# Patient Record
Sex: Male | Born: 1945
Health system: Southern US, Community
[De-identification: ages and names within clinical notes are randomized; demographics above are authoritative.]

## PROBLEM LIST (undated history)

## (undated) DIAGNOSIS — N529 Male erectile dysfunction, unspecified: Secondary | ICD-10-CM

## (undated) DIAGNOSIS — E782 Mixed hyperlipidemia: Secondary | ICD-10-CM

## (undated) DIAGNOSIS — I10 Essential (primary) hypertension: Secondary | ICD-10-CM

## (undated) DIAGNOSIS — N400 Enlarged prostate without lower urinary tract symptoms: Secondary | ICD-10-CM

## (undated) DIAGNOSIS — K8020 Calculus of gallbladder without cholecystitis without obstruction: Secondary | ICD-10-CM

## (undated) DIAGNOSIS — K219 Gastro-esophageal reflux disease without esophagitis: Secondary | ICD-10-CM

## (undated) HISTORY — DX: Calculus of gallbladder without cholecystitis without obstruction: K80.20

## (undated) HISTORY — DX: Benign prostatic hyperplasia without lower urinary tract symptoms: N40.0

## (undated) HISTORY — DX: Mixed hyperlipidemia: E78.2

## (undated) HISTORY — DX: Gastro-esophageal reflux disease without esophagitis: K21.9

## (undated) HISTORY — DX: Male erectile dysfunction, unspecified: N52.9

## (undated) HISTORY — DX: Essential (primary) hypertension: I10

---

## 2011-05-08 ENCOUNTER — Ambulatory Visit (INDEPENDENT_AMBULATORY_CARE_PROVIDER_SITE_OTHER): Payer: No Typology Code available for payment source | Admitting: General Surgery

## 2011-05-08 ENCOUNTER — Encounter (INDEPENDENT_AMBULATORY_CARE_PROVIDER_SITE_OTHER): Payer: Self-pay | Admitting: General Surgery

## 2011-05-08 VITALS — BP 136/88 | HR 84 | Temp 98.4°F | Resp 12 | Ht 71.0 in | Wt 205.0 lb

## 2011-05-08 DIAGNOSIS — K8010 Calculus of gallbladder with chronic cholecystitis without obstruction: Secondary | ICD-10-CM

## 2011-05-08 NOTE — Progress Notes (Signed)
Subjective:     Patient ID: Joshua Ryan., male   DOB: Jan 02, 1946, 65 y.o.   MRN: [id number] The patient is a 65 year old Caucasian male who is referred to [place] by Dr. Duane Lope for minimally symptomatic gallstone the patient says he was seen in Dr. Tenny Craw who was annual physical examination and he asked if there was anything else and he described intermitted pain in the right upper abdomen and he was referred for an ultrasound that the needle ultrasound lab that showed a 1.4 cm stone in the neck of the gallbladder but no abnormalities of inflammation or thickening of the gallbladder wall or dilated common bile to. The patient states that he has had pain in the right shoulder but at that time he had definite limitation of motion of his shoulder and this pain does not seem to be the pain that was prominent referred gallbladder. It is not described his pain has awakened him in a night all of the very severe nature but more of a fullness but not necessarily associated with postprandial pain the patient has been followed for slightly elevated PSA and a mildly elevated glucose and possibly GERD HPI   Review of Systems History reviewed. No pertinent past surgical history. No Known Allergies Current Outpatient Prescriptions  Medication Sig Dispense Refill  . aspirin 81 MG tablet Take 81 mg by mouth daily.        . cholecalciferol (VITAMIN D) 1000 UNITS tablet Take 1,000 Units by mouth daily.        Marland Kitchen KRILL OIL PO Take by mouth.        Marland Kitchen omeprazole (PRILOSEC) 20 MG capsule Take 20 mg by mouth daily.        . Saw Palmetto, Serenoa repens, (SAW PALMETTO PO) Take by mouth daily.        Marland Kitchen telmisartan (MICARDIS) 80 MG tablet Take 80 mg by mouth daily.        . vitamin E (VITAMIN E) 400 UNIT capsule Take 400 Units by mouth daily.             Objective:   Physical ExamBP 136/88  Pulse 84  Temp(Src) 98.4 F (36.9 C) (Temporal)  Resp 12  Ht 5\' 11"  (1.803 m)  Wt 205 lb (92.987 kg)  BMI 28.59 kg/m2 On  physical examination the patient is in no pain at examination and palpation of the right upper quadrant he's got good breath sounds bilaterally and I was unable to review his ultrasound with him since it was performed at the [facility] ultrasound lab which is not in at the epic. I reviewed the low booklet with him the gallbladder causes pain I could not find any evidence of any recent liver tests performed even though Dr. Tenny Craw in his history stated the liver was normal I took that that he does not have an enlarged liver even that there was some question of fatty infiltration of liver on ultrasound.   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 I have reviewed the low booklet with him about small portions, low fat ,no bulky food and not to eat any head of bed shortly afterwards. He'll discuss this with his wife  and he elects not to proceed with cholecystectomy in the near future understands that I will be retiring at the and of December and that he would need to be reexamined briefly by one of the partners therefore proceeded with the cholecystectomy next year or at a later time    Plan:     Possible schedule of cholecystectomy this year patient probably will elect to wait and have his surgery performed early next year. If he is having more intense or more frequent attacks he understands that he should proceed with a cholecystectomy promptly and it could most likely be done as an outpatient at [facility].

## 2011-05-08 NOTE — Patient Instructions (Signed)
If you have a gallbladder attack he need to consider having a cholecystectomy and cholangiogram. Next recurrent and prevent a gallbladder attack its important to stay on, low-fa,t diet not eating and when a bed for 2-3 hour and avoid eating a large bulky meal salad etc. if you are having more frequent attacks to recommend proceed on with a laparoscopic cholecystectomy and cholangiogram. I am retiring at the end of the year have poor does be happy to manage her problem is elected this up and after 1st of January

## 2011-07-31 ENCOUNTER — Encounter (INDEPENDENT_AMBULATORY_CARE_PROVIDER_SITE_OTHER): Payer: Self-pay | Admitting: Obstetrics and Gynecology

## 2015-01-26 ENCOUNTER — Encounter: Payer: Self-pay | Admitting: Vascular Surgery

## 2015-01-26 ENCOUNTER — Other Ambulatory Visit: Payer: Self-pay | Admitting: *Deleted

## 2015-01-26 DIAGNOSIS — L97222 Non-pressure chronic ulcer of left calf with fat layer exposed: Secondary | ICD-10-CM

## 2015-01-26 DIAGNOSIS — I83893 Varicose veins of bilateral lower extremities with other complications: Secondary | ICD-10-CM

## 2015-04-13 ENCOUNTER — Encounter: Payer: Self-pay | Admitting: Vascular Surgery

## 2015-04-13 ENCOUNTER — Encounter (HOSPITAL_COMMUNITY): Payer: Self-pay

## 2015-05-19 ENCOUNTER — Encounter: Payer: Self-pay | Admitting: Surgery

## 2015-05-23 ENCOUNTER — Encounter: Payer: Self-pay | Admitting: Surgery

## 2015-05-23 ENCOUNTER — Ambulatory Visit (HOSPITAL_COMMUNITY)
Admission: RE | Admit: 2015-05-23 | Discharge: 2015-05-23 | Disposition: A | Discharge status: Home / Self Care | Payer: BLUE CROSS/BLUE SHIELD | Source: Ambulatory Visit | Attending: Vascular Surgery | Admitting: Vascular Surgery

## 2015-05-23 ENCOUNTER — Ambulatory Visit (INDEPENDENT_AMBULATORY_CARE_PROVIDER_SITE_OTHER): Payer: BLUE CROSS/BLUE SHIELD | Admitting: Surgery

## 2015-05-23 VITALS — BP 153/93 | HR 88 | Temp 98.5°F | Resp 16 | Ht 70.0 in | Wt 194.0 lb

## 2015-05-23 DIAGNOSIS — I83893 Varicose veins of bilateral lower extremities with other complications: Secondary | ICD-10-CM | POA: Diagnosis not present

## 2015-05-23 DIAGNOSIS — I872 Venous insufficiency (chronic) (peripheral): Secondary | ICD-10-CM | POA: Diagnosis not present

## 2015-05-23 NOTE — Progress Notes (Signed)
Patient name: Joshua Ryan. MRN: [id number] DOB: September 10, 1945 Sex: male   Referred by: Dr. Harrington Challenger  Reason for referral:  Chief Complaint  Patient presents with  . New Evaluation    bilateral varicose veins    HISTORY OF PRESENT ILLNESS: This is a very pleasant 69 year old gentleman who comes in today for evaluation of varicose veins.  Patient has had varicosities for many years.  He complains that they itch and he is constantly scratching them.  After standing on his legs for long periods of time he feels that his legs get to be very tight.  He denies having a DVT.  He denies having any bleeding episodes.he has never worn compression stockings  Patient's medically managed for hypertension.  He also suffers from reflux disease.  Past Medical History  Diagnosis Date  . HTN (hypertension)   . Hematuria   . GERD (gastroesophageal reflux disease)   . Mixed hyperlipidemia   . ED (erectile dysfunction)   . BPH (benign prostatic hyperplasia)   . Gallstones     No past surgical history on file.  Social History   Social History  . Marital Status: Married    Spouse Name: N/A  . Number of Children: N/A  . Years of Education: N/A   Occupational History  . Not on file.   Social History Main Topics  . Smoking status: Never Smoker   . Smokeless tobacco: Never Used  . Alcohol Use: 0.0 oz/week    0 Standard drinks or equivalent per week  . Drug Use: No  . Sexual Activity: Not on file   Other Topics Concern  . Not on file   Social History Narrative    Family History  Problem Relation Age of Onset  . Lung disease Father     chronic obx lung disease  . Breast cancer Sister   . Cancer Maternal Grandmother     lung?    Allergies as of 05/23/2015  . (No Known Allergies)    Current Outpatient Prescriptions on File Prior to Visit  Medication Sig Dispense Refill  . aspirin 81 MG tablet Take 81 mg by mouth daily.      . cholecalciferol (VITAMIN D) 1000 UNITS  tablet Take 1,000 Units by mouth daily.      Marland Kitchen KRILL OIL PO Take by mouth.      Marland Kitchen omeprazole (PRILOSEC) 20 MG capsule Take 20 mg by mouth daily.      . Saw Palmetto, Serenoa repens, (SAW PALMETTO PO) Take by mouth daily.      Marland Kitchen telmisartan (MICARDIS) 80 MG tablet Take 80 mg by mouth daily.      . vitamin E (VITAMIN E) 400 UNIT capsule Take 400 Units by mouth daily.       No current facility-administered medications on file prior to visit.     REVIEW OF SYSTEMS: Cardiovascular: No chest pain, chest pressure, palpitations, orthopnea, or dyspnea on exertion. No claudication or rest pain,  No history of DVT or phlebitis. Pulmonary: No productive cough, asthma or wheezing. Neurologic: No weakness, paresthesias, aphasia, or amaurosis. No dizziness. Hematologic: No bleeding problems or clotting disorders. Musculoskeletal: No joint pain or joint swelling. Gastrointestinal: No blood in stool or hematemesis Genitourinary: No dysuria or hematuria. Psychiatric:: No history of major depression. Integumentary: No rashes or ulcers. Constitutional: No fever or chills.  PHYSICAL EXAMINATION:  Filed Vitals:   05/23/15 1442 05/23/15 1445  BP: 152/95 153/93  Pulse: 88 88  Temp: 98.5 F (  36.9 C)   Resp: 16   Height: 5\' 10"  (1.778 m)   Weight: 194 lb (87.998 kg)   SpO2: 98%    Body mass index is 27.84 kg/(m^2). General: The patient appears their stated age.   HEENT:  No gross abnormalities Pulmonary: Respirations are non-labored Musculoskeletal: There are no major deformities.   Neurologic: No focal weakness or paresthesias are detected, Skin: There are no ulcer or rashes noted. Psychiatric: The patient has normal affect. Cardiovascular: palpable pedal pulses bilaterally.  Trace edema bilaterally.  Prominent varicosity going anteriorly on the left  Diagnostic Studies: I have ordered and reviewed his venous reflux evaluation.  On the right he has reflux in the common femoral femoral popliteal  and saphenofemoral vein.  He also has reflux and a vein of Giacomini.  Maximum vein diameter on the right is 0.60 saphenofemoral junction.  On the left there is reflux in the common femoral, femoral, popliteal and saphenofemoral junction.  Maximum diameter 0.56 cm.   Assessment:  Chronic venous insufficiency, bilateral lower extremity, left greater than right Plan: The patient was placed into 20-30 thigh-high compression stockings.  He is also encouraged to keep his legs elevated.  He will return in 3 months for repeat evaluation and consideration of laser ablation and stab phlebectomy of the prominent varicosities on the left and possibly on the right     V. Leia Alf, M.D. [facility] Office: [phone] Pager:  [phone]

## 2015-08-17 ENCOUNTER — Encounter: Payer: Self-pay | Admitting: Vascular Surgery

## 2015-08-23 ENCOUNTER — Encounter: Payer: Self-pay | Admitting: Vascular Surgery

## 2015-08-23 ENCOUNTER — Ambulatory Visit (INDEPENDENT_AMBULATORY_CARE_PROVIDER_SITE_OTHER): Payer: BLUE CROSS/BLUE SHIELD | Admitting: Vascular Surgery

## 2015-08-23 VITALS — BP 143/96 | HR 80 | Temp 98.0°F | Resp 16 | Ht 71.0 in | Wt 195.0 lb

## 2015-08-23 DIAGNOSIS — I83893 Varicose veins of bilateral lower extremities with other complications: Secondary | ICD-10-CM | POA: Diagnosis not present

## 2015-08-23 NOTE — Progress Notes (Signed)
Subjective:     Patient ID: Joshua Shorten., male   DOB: 12/10/1945, 70 y.o.   MRN: [id number]  HPI This 70 year old male returns for continued follow-up regarding his painful varicosities in both legs left worse than right. He has large ropey varicosities in the left anterior thigh lateral thigh and lateral calf and also bulging varicosities in the right posterior calf extending medially. He has a history of DVT, from a phlebitis, stasis ulcers, or bleeding. He has developed swelling is a day progresses. He has tried long-leg elastic compression stockings 20-30 mm gradient as well as elevation and ibuprofen without success. This is affecting his daily living because of the pain and swelling.   Past Medical History  Diagnosis Date  . HTN (hypertension)   . Hematuria   . GERD (gastroesophageal reflux disease)   . Mixed hyperlipidemia   . ED (erectile dysfunction)   . BPH (benign prostatic hyperplasia)   . Gallstones     Social History  Substance Use Topics  . Smoking status: Never Smoker   . Smokeless tobacco: Never Used  . Alcohol Use: 0.0 oz/week    0 Standard drinks or equivalent per week    Family History  Problem Relation Age of Onset  . Lung disease Father     chronic obx lung disease  . Breast cancer Sister   . Cancer Maternal Grandmother     lung?    No Known Allergies   Current outpatient prescriptions:  .  aspirin 81 MG tablet, Take 81 mg by mouth daily.  , Disp: , Rfl:  .  cholecalciferol (VITAMIN D) 1000 UNITS tablet, Take 1,000 Units by mouth daily.  , Disp: , Rfl:  .  KRILL OIL PO, Take by mouth.  , Disp: , Rfl:  .  omeprazole (PRILOSEC) 20 MG capsule, Take 20 mg by mouth daily.  , Disp: , Rfl:  .  Saw Palmetto, Serenoa repens, (SAW PALMETTO PO), Take by mouth daily.  , Disp: , Rfl:  .  telmisartan (MICARDIS) 80 MG tablet, Take 80 mg by mouth daily.  , Disp: , Rfl:  .  vitamin E (VITAMIN E) 400 UNIT capsule, Take 400 Units by mouth daily.  , Disp: , Rfl:    Filed Vitals:   08/23/15 1337 08/23/15 1338  BP: 150/98 143/96  Pulse: 81 80  Temp: 98 F (36.7 C)   Resp: 16   Height: 5\' 11"  (1.803 m)   Weight: 195 lb (88.451 kg)   SpO2: 99%     Body mass index is 27.21 kg/(m^2).           Review of Systems Denies chest pain, dyspnea on exertion, PND, orthopnea, hemoptysis, tobacco abuse.     Objective:   Physical Exam BP 143/96 mmHg  Pulse 80  Temp(Src) 98 F (36.7 C)  Resp 16  Ht 5\' 11"  (1.803 m)  Wt 195 lb (88.451 kg)  BMI 27.21 kg/m2  SpO2 99%   Gen. Well-developed well-nourished male in no apparent distress alert and oriented 3  left leg with extensive large ropey varicosities beginning in the mid thigh anteriorly extending down to lateral to the knee and the lateral calf. 1+ edema distally. No active ulcer noted. 3+ dorsalis pedis pulse palpable. Right leg with bulging varicosities in the posterior calf extending medially with no hyperpigmentation or ulceration noted. 3+ dorsalis pedis pulse palpable.  I reviewed the formal duplex exam from last visit and also performed an independent SonoSite bedside exam today. Left  leg has gross reflux in the great saphenous vein proximally which extends laterally to supply these large tortuous bulging varicosities. There is no DVT on the left. The right leg has a large right small saphenous vein supplying the painful varicosities in the calf which extends up into the mid thigh of large caliber. There is gross reflux in both of these large veins.     Assessment:      bilateral painful varicosities due to gross reflux left great saphenous system in right small saphenous system. These are causing symptoms which are resistant to conservative measures including long-leg elastic compression stockings 20-30 mm gradient, elevation, and ibuprofen. This is affecting patient's daily living.     Plan:      patient needs number-one laser ablation left great saphenous vein plus greater than 20  stabs back with painful varicosities followed by #2 laser ablation right small saphenous vein plus 10-20 stab phlebectomy of painful varicosities. We will proceed with precertification for medicine the near future and relieve his symptoms.

## 2015-09-13 ENCOUNTER — Other Ambulatory Visit: Payer: Self-pay | Admitting: *Deleted

## 2015-09-13 DIAGNOSIS — I83893 Varicose veins of bilateral lower extremities with other complications: Secondary | ICD-10-CM

## 2015-10-07 ENCOUNTER — Encounter: Payer: Self-pay | Admitting: Vascular Surgery

## 2015-10-14 ENCOUNTER — Encounter: Payer: Self-pay | Admitting: Vascular Surgery

## 2015-10-17 ENCOUNTER — Encounter: Payer: Self-pay | Admitting: Vascular Surgery

## 2015-10-17 ENCOUNTER — Ambulatory Visit (INDEPENDENT_AMBULATORY_CARE_PROVIDER_SITE_OTHER): Payer: BLUE CROSS/BLUE SHIELD | Admitting: Vascular Surgery

## 2015-10-17 VITALS — BP 159/97 | HR 77 | Temp 98.0°F | Resp 16 | Ht 69.5 in | Wt 186.6 lb

## 2015-10-17 DIAGNOSIS — I83892 Varicose veins of left lower extremities with other complications: Secondary | ICD-10-CM | POA: Insufficient documentation

## 2015-10-17 NOTE — Progress Notes (Signed)
Laser Ablation Procedure    Date: 10/17/2015   Joshua Ryan. DOB:19-Mar-1946  Consent signed: Yes    Surgeon:  Dr. Nelda Severe. Kellie Simmering  Procedure: Laser Ablation: left Greater Saphenous Vein  BP 159/97 mmHg  Pulse 77  Temp(Src) 98 F (36.7 C)  Resp 16  Ht 5' 9.5" (1.765 m)  Wt 186 lb 9.6 oz (84.641 kg)  BMI 27.17 kg/m2  SpO2 98%  Tumescent Anesthesia: 300 cc 0.9% NaCl with 50 cc Lidocaine HCL with 1% Epi and 15 cc 8.4% NaHCO3  Local Anesthesia: 7 cc Lidocaine HCL and NaHCO3 (ratio 2:1)  Pulsed Mode: 15 watts, 562ms delay, 1.0 duration  Total Energy:    928          Total Pulses: 62               Total Time: 1:02    Stab Phlebectomy: >20 Sites: Thigh and Calf  Patient tolerated procedure well  Notes:   Description of Procedure:  After marking the course of the secondary varicosities, the patient was placed on the operating table in the supine position, and the left leg was prepped and draped in sterile fashion.   Local anesthetic was administered and under ultrasound guidance the saphenous vein was accessed with a micro needle and guide wire; then the mirco puncture sheath was placed.  A guide wire was inserted saphenofemoral junction , followed by a 5 french sheath.  The position of the sheath and then the laser fiber below the junction was confirmed using the ultrasound.  Tumescent anesthesia was administered along the course of the saphenous vein using ultrasound guidance. The patient was placed in Trendelenburg position and protective laser glasses were placed on patient and staff, and the laser was fired at 15 watts continuous mode advancing 1-16mm/second for a total of 928 joules.   For stab phlebectomies, local anesthetic was administered at the previously marked varicosities, and tumescent anesthesia was administered around the vessels.  Greater than 20 stab wounds were made using the tip of an 11 blade. And using the vein hook, the phlebectomies were performed using a  hemostat to avulse the varicosities.  Adequate hemostasis was achieved.    Steri strips were applied to the stab wounds and ABD pads and thigh high compression stockings were applied.  Ace wrap bandages were applied over the phlebectomy sites and at the top of the saphenofemoral junction. Blood loss was less than 15 cc.  The patient ambulated out of the operating room having tolerated the procedure well.2

## 2015-10-17 NOTE — Progress Notes (Signed)
Subjective:     Patient ID: Joshua Ryan., male   DOB: 09-01-1945, 70 y.o.   MRN: [id number]  HPI  this 70 year old male had laser ablation left great saphenous vein plus greater than 20 stab phlebectomy of painful varicosities performed under local tumescent anesthesia. A total of 900 J of energy was utilized. He tolerated the procedure well.   Review of Systems     Objective:   Physical Exam BP 159/97 mmHg  Pulse 77  Temp(Src) 98 F (36.7 C)  Resp 16  Ht 5' 9.5" (1.765 m)  Wt 186 lb 9.6 oz (84.641 kg)  BMI 27.17 kg/m2  SpO2 98%       Assessment:     Well-tolerated laser ablation left great saphenous vein-anterior accessory branch plus greater than 20 stab phlebectomy of painful varicosities performed under local tumescent anesthesia    Plan:     Return in 1 week for venous duplex exam to confirm closure left great saphenous vein

## 2015-10-18 ENCOUNTER — Telehealth: Payer: Self-pay | Admitting: *Deleted

## 2015-10-18 NOTE — Telephone Encounter (Signed)
Pt was sleeping so talked to the wife who reports he had a good night and had no bleeding from the stab phleb sites. Discussed ace wraps. Follow prn.

## 2015-10-21 ENCOUNTER — Encounter: Payer: Self-pay | Admitting: Vascular Surgery

## 2015-10-24 ENCOUNTER — Encounter: Payer: Self-pay | Admitting: Vascular Surgery

## 2015-10-24 ENCOUNTER — Ambulatory Visit (INDEPENDENT_AMBULATORY_CARE_PROVIDER_SITE_OTHER): Payer: Self-pay | Admitting: Vascular Surgery

## 2015-10-24 ENCOUNTER — Ambulatory Visit (HOSPITAL_COMMUNITY)
Admission: RE | Admit: 2015-10-24 | Discharge: 2015-10-24 | Disposition: A | Discharge status: Home / Self Care | Payer: BLUE CROSS/BLUE SHIELD | Source: Ambulatory Visit | Attending: Vascular Surgery | Admitting: Vascular Surgery

## 2015-10-24 VITALS — BP 148/97 | HR 64 | Temp 99.5°F | Resp 18 | Ht 69.5 in | Wt 188.5 lb

## 2015-10-24 DIAGNOSIS — Z9889 Other specified postprocedural states: Secondary | ICD-10-CM | POA: Diagnosis not present

## 2015-10-24 DIAGNOSIS — I10 Essential (primary) hypertension: Secondary | ICD-10-CM | POA: Insufficient documentation

## 2015-10-24 DIAGNOSIS — K219 Gastro-esophageal reflux disease without esophagitis: Secondary | ICD-10-CM | POA: Insufficient documentation

## 2015-10-24 DIAGNOSIS — I83893 Varicose veins of bilateral lower extremities with other complications: Secondary | ICD-10-CM

## 2015-10-24 DIAGNOSIS — E782 Mixed hyperlipidemia: Secondary | ICD-10-CM | POA: Insufficient documentation

## 2015-10-24 NOTE — Progress Notes (Signed)
Subjective:     Patient ID: Joshua Shorten., male   DOB: [birth year], 70 y.o.   MRN: [id number]  HPI this 70 year old male returns 1 week post-laser ablation left great saphenous vein with multiple stab phlebectomy of painful varicosities for gross reflux. Patient has had some mild discomfort along the course of the anterior accessory branch of the left great saphenous vein. He's had no pain in the stab phlebectomy sites. He has worn his elastic compression stockings and take an ibuprofen as instructed. He has had no chest pain, dyspnea on exertion, PND, orthopnea, or hemoptysis.     Review of Systems     Objective:   Physical Exam BP 148/97 mmHg  Pulse 64  Temp(Src) 99.5 F (37.5 C) (Oral)  Resp 18  Ht 5' 9.5" (1.765 m)  Wt 188 lb 8 oz (85.503 kg)  BMI 27.45 kg/m2  Gen. well-developed well-nourished male no apparent distress alert and oriented  3 Left leg with mild discomfort to deep palpation and anterior thigh extending up to the inguinal crease. Stab phlebectomy sites all healing nicely with Steri-Strips in place. No distal edema noted. 3+ dorsalis pedis pulse palpable.  Today I ordered a venous duplex exam the left leg which I reviewed and interpreted. There is no DVT. There is total closure of the anterior accessory branch of the great saphenous vein from the distal thigh up to near the saphenofemoral junction     Assessment:     Successful laser ablation left anterior accessory branch great saphenous vein plus multiple stab phlebectomy of painful varicosities-patient doing very well    Plan:     Plan return next week for laser ablation right small saphenous vein with multiple stab phlebectomy

## 2015-10-26 ENCOUNTER — Encounter: Payer: Self-pay | Admitting: Vascular Surgery

## 2015-10-31 ENCOUNTER — Ambulatory Visit (INDEPENDENT_AMBULATORY_CARE_PROVIDER_SITE_OTHER): Payer: BLUE CROSS/BLUE SHIELD | Admitting: Vascular Surgery

## 2015-10-31 ENCOUNTER — Encounter: Payer: Self-pay | Admitting: Vascular Surgery

## 2015-10-31 VITALS — BP 145/97 | HR 76 | Temp 97.8°F | Resp 16 | Ht 70.5 in | Wt 188.0 lb

## 2015-10-31 DIAGNOSIS — I83891 Varicose veins of right lower extremities with other complications: Secondary | ICD-10-CM | POA: Insufficient documentation

## 2015-10-31 NOTE — Progress Notes (Signed)
Laser Ablation Procedure    Date: 10/31/2015   Joshua Ryan. DOB:May 21, 1946  Consent signed: Yes    Surgeon:  Dr. Nelda Severe. Kellie Simmering  Procedure: Laser Ablation: right Small Saphenous Vein  BP 145/97 mmHg  Pulse 76  Temp(Src) 97.8 F (36.6 C)  Resp 16  Ht 5' 10.5" (1.791 m)  Wt 188 lb (85.276 kg)  BMI 26.58 kg/m2  SpO2 98%  Tumescent Anesthesia: 180 cc 0.9% NaCl with 50 cc Lidocaine HCL with 1% Epi and 15 cc 8.4% NaHCO3  Local Anesthesia: 5 cc Lidocaine HCL and NaHCO3 (ratio 2:1)  Pulsed Mode: 15 watts, 558ms delay, 1.0 duration  Total Energy: 569             Total Pulses:  39              Total Time: :38    Stab Phlebectomy: 10-20 Sites: Thigh and Calf  Patient tolerated procedure well  Notes:   Description of Procedure:  After marking the course of the secondary varicosities, the patient was placed on the operating table in the prone position, and the right leg was prepped and draped in sterile fashion.   Local anesthetic was administered and under ultrasound guidance the saphenous vein was accessed with a micro needle and guide wire; then the mirco puncture sheath was placed.  A guide wire was inserted saphenopopliteal junction , followed by a 5 french sheath.  The position of the sheath and then the laser fiber below the junction was confirmed using the ultrasound.  Tumescent anesthesia was administered along the course of the saphenous vein using ultrasound guidance. The patient was placed in Trendelenburg position and protective laser glasses were placed on patient and staff, and the laser was fired at 15 watts continuous mode advancing 1-29mm/second for a total of 569 joules.   For stab phlebectomies, local anesthetic was administered at the previously marked varicosities, and tumescent anesthesia was administered around the vessels.  Ten to 20 stab wounds were made using the tip of an 11 blade. And using the vein hook, the phlebectomies were performed using a hemostat  to avulse the varicosities.  Adequate hemostasis was achieved.     Steri strips were applied to the stab wounds and ABD pads and thigh high compression stockings were applied.  Ace wrap bandages were applied over the phlebectomy sites and at the top of the saphenopopliteal junction. Blood loss was less than 15 cc.  The patient ambulated out of the operating room having tolerated the procedure well.

## 2015-10-31 NOTE — Progress Notes (Signed)
Filed Vitals:   10/31/15 1237 10/31/15 1238  BP: 146/97 145/97  Pulse: 76   Temp: 97.8 F (36.6 C)   Resp: 16   Height: 5' 10.5" (1.791 m)   Weight: 188 lb (85.276 kg)   SpO2: 98%

## 2015-10-31 NOTE — Progress Notes (Signed)
Subjective:     Patient ID: Joshua Shorten., male   DOB: 17-Oct-1945, 70 y.o.   MRN: [id number]  HPI this 70 year old male had laser ablation of the right small saphenous vein plus multiple stab phlebectomy of painful varicosities performed under local tumescent anesthesia. A total of 689 J of energy was utilized. He tolerated the procedure well.   Review of Systems     Objective:   Physical Exam BP 145/97 mmHg  Pulse 76  Temp(Src) 97.8 F (36.6 C)  Resp 16  Ht 5' 10.5" (1.791 m)  Wt 188 lb (85.276 kg)  BMI 26.58 kg/m2  SpO2 98%       Assessment:     Well-tolerated laser ablation right small saphenous vein plus multiple stab phlebectomy (10-20) of painful varicosities performed under local tumescent anesthesia    Plan:     Return in 1 week for venous duplex exam to confirm closure right small saphenous vein

## 2015-11-01 ENCOUNTER — Telehealth: Payer: Self-pay | Admitting: *Deleted

## 2015-11-01 NOTE — Telephone Encounter (Signed)
Pt was still asleep. Wife said he had a good night. No bleeding, pain, concerns. Following all instructions.

## 2015-11-07 ENCOUNTER — Encounter: Payer: Self-pay | Admitting: Vascular Surgery

## 2015-11-07 ENCOUNTER — Ambulatory Visit (HOSPITAL_COMMUNITY)
Admission: RE | Admit: 2015-11-07 | Discharge: 2015-11-07 | Disposition: A | Discharge status: Home / Self Care | Payer: BLUE CROSS/BLUE SHIELD | Source: Ambulatory Visit | Attending: Vascular Surgery | Admitting: Vascular Surgery

## 2015-11-07 ENCOUNTER — Ambulatory Visit (INDEPENDENT_AMBULATORY_CARE_PROVIDER_SITE_OTHER): Payer: Self-pay | Admitting: Vascular Surgery

## 2015-11-07 VITALS — BP 135/93 | HR 70 | Temp 98.0°F | Resp 16 | Ht 70.5 in | Wt 188.0 lb

## 2015-11-07 DIAGNOSIS — I83891 Varicose veins of right lower extremities with other complications: Secondary | ICD-10-CM

## 2015-11-07 DIAGNOSIS — I83893 Varicose veins of bilateral lower extremities with other complications: Secondary | ICD-10-CM | POA: Diagnosis present

## 2015-11-07 DIAGNOSIS — E782 Mixed hyperlipidemia: Secondary | ICD-10-CM | POA: Insufficient documentation

## 2015-11-07 DIAGNOSIS — K219 Gastro-esophageal reflux disease without esophagitis: Secondary | ICD-10-CM | POA: Diagnosis not present

## 2015-11-07 DIAGNOSIS — I10 Essential (primary) hypertension: Secondary | ICD-10-CM | POA: Insufficient documentation

## 2015-11-07 NOTE — Progress Notes (Signed)
Subjective:     Patient ID: Joshua Ryan., male   DOB: 01-15-1946, 70 y.o.   MRN: [id number]  HPI this 70 year old male returns 1 week post-laser ablation right small saphenous vein with multiple stab phlebectomy of painful varicosities. He has had mild discomfort in the posterior calf which is now essentially resolved. He has worn his elastic compression stockings and take an ibuprofen as instructed. He has not appreciated any distal swelling. Stab phlebectomy sites have caused no pain.   Review of Systems     Objective:   Physical Exam BP 135/93 mmHg  Pulse 70  Temp(Src) 98 F (36.7 C)  Resp 16  Ht 5' 10.5" (1.791 m)  Wt 188 lb (85.276 kg)  BMI 26.58 kg/m2  SpO2 98%  Gen. well-developed well-nourished male no apparent distress alert and oriented 3 Right leg with nicely healing stab phlebectomy sites. No distal edema noted. 3+ dorsalis pedis pulse palpable. Mild discomfort to deep palpation over small saphenous vein.  Today I ordered a venous duplex exam the right leg which I reviewed and interpreted. There is no DVT. There is total closure of the right small saphenous vein.     Assessment:     Successful laser ablation right small saphenous vein with multiple stab phlebectomy and left great saphenous vein with multiple stab phlebectomy for painful varicosities    Plan:     Return to see me on when necessary basis

## 2016-11-06 ENCOUNTER — Encounter: Payer: Self-pay | Admitting: Oncology

## 2016-11-29 ENCOUNTER — Telehealth: Payer: Self-pay | Admitting: Oncology

## 2016-11-29 ENCOUNTER — Ambulatory Visit (HOSPITAL_BASED_OUTPATIENT_CLINIC_OR_DEPARTMENT_OTHER): Payer: BLUE CROSS/BLUE SHIELD

## 2016-11-29 ENCOUNTER — Ambulatory Visit (HOSPITAL_BASED_OUTPATIENT_CLINIC_OR_DEPARTMENT_OTHER): Payer: BLUE CROSS/BLUE SHIELD | Admitting: Oncology

## 2016-11-29 VITALS — BP 131/87 | HR 85 | Temp 98.6°F | Resp 18 | Ht 70.0 in | Wt 176.8 lb

## 2016-11-29 DIAGNOSIS — I10 Essential (primary) hypertension: Secondary | ICD-10-CM | POA: Diagnosis not present

## 2016-11-29 DIAGNOSIS — D751 Secondary polycythemia: Secondary | ICD-10-CM

## 2016-11-29 DIAGNOSIS — M353 Polymyalgia rheumatica: Secondary | ICD-10-CM | POA: Diagnosis not present

## 2016-11-29 DIAGNOSIS — D45 Polycythemia vera: Secondary | ICD-10-CM

## 2016-11-29 LAB — COMPREHENSIVE METABOLIC PANEL
ALT: 22 U/L (ref 0–55)
AST: 19 U/L (ref 5–34)
Albumin: 4.2 g/dL (ref 3.5–5.0)
Alkaline Phosphatase: 83 U/L (ref 40–150)
Anion Gap: 9 mEq/L (ref 3–11)
BUN: 20.3 mg/dL (ref 7.0–26.0)
CHLORIDE: 106 meq/L (ref 98–109)
CO2: 26 meq/L (ref 22–29)
Calcium: 9.9 mg/dL (ref 8.4–10.4)
Creatinine: 1.1 mg/dL (ref 0.7–1.3)
EGFR: 69 mL/min/{1.73_m2} — AB (ref >90)
GLUCOSE: 86 mg/dL (ref 70–140)
POTASSIUM: 4.5 meq/L (ref 3.5–5.1)
SODIUM: 141 meq/L (ref 136–145)
Total Bilirubin: 1.63 mg/dL — ABNORMAL HIGH (ref 0.20–1.20)
Total Protein: 6.8 g/dL (ref 6.4–8.3)

## 2016-11-29 LAB — CBC WITH DIFFERENTIAL/PLATELET
BASO%: 0.8 % (ref 0.0–2.0)
BASOS ABS: 0.2 10*3/uL — AB (ref 0.0–0.1)
EOS%: 2.3 % (ref 0.0–7.0)
Eosinophils Absolute: 0.5 10*3/uL (ref 0.0–0.5)
HCT: 61.5 % — ABNORMAL HIGH (ref 38.4–49.9)
HGB: 19.4 g/dL — ABNORMAL HIGH (ref 13.0–17.1)
LYMPH%: 11.3 % — AB (ref 14.0–49.0)
MCH: 23 pg — ABNORMAL LOW (ref 27.2–33.4)
MCHC: 31.5 g/dL — AB (ref 32.0–36.0)
MCV: 72.9 fL — AB (ref 79.3–98.0)
MONO#: 1.1 10*3/uL — ABNORMAL HIGH (ref 0.1–0.9)
MONO%: 5.6 % (ref 0.0–14.0)
NEUT#: 15.7 10*3/uL — ABNORMAL HIGH (ref 1.5–6.5)
NEUT%: 80 % — AB (ref 39.0–75.0)
NRBC: 0 % (ref 0–0)
Platelets: 604 10*3/uL — ABNORMAL HIGH (ref 140–400)
RBC: 8.44 10*6/uL — AB (ref 4.20–5.82)
RDW: 22.6 % — AB (ref 11.0–14.6)
WBC: 19.7 10*3/uL — ABNORMAL HIGH (ref 4.0–10.3)
lymph#: 2.2 10*3/uL (ref 0.9–3.3)

## 2016-11-29 NOTE — Telephone Encounter (Signed)
Gave patient AVS and calender per 5/17 los.  

## 2016-11-29 NOTE — Progress Notes (Signed)
Reason for Referral: Polycythemia.   HPI: 71 year old gentleman with history of hypertension and polymyalgia rheumatica referred to me for reevaluation of polycythemia. He is a gentleman in reasonably good health who have had recurrent PMR in the past and was treated with prednisone with excellent success. Most recently developed a relapse of this condition and was evaluated by Dr. Lenna Gilford and was started on prednisone at 20 mg daily. He is currently been tapered down to 12.5. He had a CBC done which showed a white cell count of 28.4, hemoglobin of 19.3 and a platelet count of 684. His MCV was 69. The differential showed normal lymphocytes, lymphs monocytes and eosinophils and basophils. Repeat CBC confirmed these findings and he was referred to me for evaluation regarding these issues. Clinically, he is asymptomatic at this time. He denied any headaches or neurological deficits. He denied any angina or dyspnea on exertion. He remains active and continues to work full time. He denied any smoking or toxic fume inhalation. He denied been on any hormone supplements.  He does not report any neurological deficits including alteration of mental status psychiatric issues or depression. He does not report any fevers, chills, sweats or weight loss. He denied any fatigue or excessive tiredness. He does not report any chest pain, palpitation, orthopnea or leg edema. He does not report any cough, wheezing or hemoptysis. He does not report any dysphagia exertion. He does not report any nausea, vomiting or abdominal pain. He does not report any frequency urgency or hesitancy. He does not report any skeletal complaints. Remaining review of systems unremarkable.   Past Medical History:  Diagnosis Date  . BPH (benign prostatic hyperplasia)   . ED (erectile dysfunction)   . Gallstones   . GERD (gastroesophageal reflux disease)   . Hematuria   . HTN (hypertension)   . Mixed hyperlipidemia   :  No past surgical history  on file.:   Current Outpatient Prescriptions:  .  aspirin 81 MG tablet, Take 81 mg by mouth daily.  , Disp: , Rfl:  .  Calcium-Magnesium-Vitamin D (CALCIUM 500 PO), Take 1 tablet by mouth 2 (two) times daily., Disp: , Rfl:  .  cetirizine (ZYRTEC) 10 MG tablet, Take 10 mg by mouth daily., Disp: , Rfl:  .  cholecalciferol (VITAMIN D) 1000 UNITS tablet, Take 2,000 Units by mouth daily. , Disp: , Rfl:  .  famotidine (PEPCID) 20 MG tablet, Take 20 mg by mouth 2 (two) times daily., Disp: , Rfl:  .  irbesartan (AVAPRO) 300 MG tablet, Take 300 mg by mouth daily., Disp: , Rfl: 3 .  KRILL OIL PO, Take 500 mg by mouth daily. , Disp: , Rfl:  .  Saw Palmetto, Serenoa repens, (SAW PALMETTO PO), Take 450 mg by mouth daily. , Disp: , Rfl: :  No Known Allergies:  Family History  Problem Relation Age of Onset  . Lung disease Father        chronic obx lung disease  . Breast cancer Sister   . Cancer Maternal Grandmother        lung?  :  Social History   Social History  . Marital status: Married    Spouse name: N/A  . Number of children: N/A  . Years of education: N/A   Occupational History  . Not on file.   Social History Main Topics  . Smoking status: Never Smoker  . Smokeless tobacco: Never Used  . Alcohol use 0.0 oz/week  . Drug use: No  .  Sexual activity: Not on file   Other Topics Concern  . Not on file   Social History Narrative  . No narrative on file  :  Pertinent items are noted in HPI.  Exam: Blood pressure 131/87, pulse 85, temperature 98.6 F (37 C), temperature source Oral, resp. rate 18, height 5\' 10"  (1.778 m), weight 176 lb 12.8 oz (80.2 kg), SpO2 97 %. General appearance: alert and cooperative with complected. Sclera was injected. Throat: Without oral ulcers or lesions. Neck: no adenopathy Back: negative Resp: clear to auscultation bilaterally Cardio: regular rate and rhythm, S1, S2 normal, no murmur, click, rub or gallop GI: soft, non-tender; bowel sounds  normal; no masses,  no organomegaly Extremities: extremities normal, atraumatic, no cyanosis or edema Pulses: 2+ and symmetric Skin: Skin color, texture, turgor normal. No rashes or lesions  Labs reviewed and a history of present illness.    Assessment and Plan:   71 year old gentleman with the following issues:  1. Polycythemia: This was detected on a CBC in April 2018 after presenting with a hemoglobin of 19.3 and an MCV of 69. This was noted in the setting of active polymyalgia rheumatica and currently on prednisone.  The differential diagnosis was discussed today including secondary causes of polycythemia. He does not have any history to suggest the smoking, toxic femur exposure, occult malignancy or hormone supplements such as testosterone injections.  Polycythemia vera is considered a possibility at this time given his leukocytosis and thrombocytosis as well. The natural course of this disease was reviewed today as well as a diagnostic workup. The first of to document this disease is obtaining JAK2 mutation and once that is confirmed, then the treatment options will be reviewed. Controlling his hemoglobin with phlebotomy would be the next step. Risks and benefits of this procedure were reviewed today is agreeable to proceed if needed to. I see very little down side to doing that procedure and controlling his hemoglobin will be important to eliminating thrombosis complications among other vascular complaints.  Cytoreductive therapy with hydroxyurea would be also an option if needed to in the future.  2. Leukocytosis and thrombocytosis: This could be reactive related to his recent prednisone but also possibility related to a myeloproliferative disorder such as polycythemia vera or essential thrombocythemia. If his workup for polycythemia vera is negative, workup for CML will be also indicated.  3. Thrombosis prophylaxis: I recommend low-dose aspirin at this time and proceeding with  phlebotomy as discussed. He does not have a history of thrombosis in the past and hydroxyurea could be considered to control his counts in the future.  4. Follow-up: Will be in 3 months after monthly phlebotomies to monitor his counts.

## 2016-11-30 LAB — ERYTHROPOIETIN: ERYTHROPOIETIN: 16.5 m[IU]/mL (ref 2.6–18.5)

## 2016-12-07 ENCOUNTER — Ambulatory Visit (HOSPITAL_BASED_OUTPATIENT_CLINIC_OR_DEPARTMENT_OTHER): Payer: BLUE CROSS/BLUE SHIELD

## 2016-12-07 ENCOUNTER — Other Ambulatory Visit: Payer: Self-pay | Admitting: Oncology

## 2016-12-07 VITALS — BP 131/85 | HR 79 | Temp 98.0°F | Resp 20

## 2016-12-07 DIAGNOSIS — D751 Secondary polycythemia: Secondary | ICD-10-CM

## 2016-12-07 DIAGNOSIS — D45 Polycythemia vera: Secondary | ICD-10-CM | POA: Insufficient documentation

## 2016-12-07 NOTE — Progress Notes (Signed)
Per Dr. Alen Blew, it's OK to do a phlebotomy today using labs from 11/29/16. Infusion room nurse notified. Phlebotomy order placed.

## 2016-12-07 NOTE — Progress Notes (Signed)
Phlebotomy. 500 gm pulled from patient with 16g PIV to Left AC. Procedure started at 1515 and ended at 1523. Patient given snack and encouraged fluids. Observed for 30 minutes afterwards. Vital Signs stable.

## 2016-12-07 NOTE — Patient Instructions (Signed)
     Therapeutic Phlebotomy, Care After Refer to this sheet in the next few weeks. These instructions provide you with information about caring for yourself after your procedure. Your health care provider may also give you more specific instructions. Your treatment has been planned according to current medical practices, but problems sometimes occur. Call your health care provider if you have any problems or questions after your procedure. What can I expect after the procedure? After the procedure, it is common to have:  Light-headedness or dizziness. You may feel faint.  Nausea.  Tiredness. Follow these instructions at home: Activity  Return to your normal activities as directed by your health care provider. Most people can go back to their normal activities right away.  Avoid strenuous physical activity and heavy lifting or pulling for about 5 hours after the procedure. Do not lift anything that is heavier than 10 lb (4.5 kg).  Athletes should avoid strenuous exercise for at least 12 hours.  Change positions slowly for the remainder of the day. This will help to prevent light-headedness or fainting.  If you feel light-headed, lie down until the feeling goes away. Eating and drinking  Be sure to eat well-balanced meals for the next 24 hours.  Drink enough fluid to keep your urine clear or pale yellow.  Avoid drinking alcohol on the day that you had the procedure. Care of the Needle Insertion Site  Keep your bandage dry. You can remove the bandage after about 5 hours or as directed by your health care provider.  If you have bleeding from the needle insertion site, elevate your arm and press firmly on the site until the bleeding stops.  If you have bruising at the site, apply ice to the area:  Put ice in a plastic bag.  Place a towel between your skin and the bag.  Leave the ice on for 20 minutes, 2-3 times a day for the first 24 hours.  If the swelling does not go away  after 24 hours, apply a warm, moist washcloth to the area for 20 minutes, 2-3 times a day. General instructions  Avoid smoking for at least 30 minutes after the procedure.  Keep all follow-up visits as directed by your health care provider. It is important to continue with further therapeutic phlebotomy treatments as directed. Contact a health care provider if:  You have redness, swelling, or pain at the needle insertion site.  You have fluid, blood, or pus coming from the needle insertion site.  You feel light-headed, dizzy, or nauseated, and the feeling does not go away.  You notice new bruising at the needle insertion site.  You feel weaker than normal.  You have a fever or chills. Get help right away if:  You have severe nausea or vomiting.  You have chest pain.  You have trouble breathing. This information is not intended to replace advice given to you by your health care provider. Make sure you discuss any questions you have with your health care provider. Document Released: 12/04/2010 Document Revised: 03/03/2016 Document Reviewed: 06/28/2014 Elsevier Interactive Patient Education  2017 Elsevier Inc.  

## 2016-12-28 ENCOUNTER — Other Ambulatory Visit (HOSPITAL_BASED_OUTPATIENT_CLINIC_OR_DEPARTMENT_OTHER): Payer: BLUE CROSS/BLUE SHIELD

## 2016-12-28 ENCOUNTER — Ambulatory Visit (HOSPITAL_BASED_OUTPATIENT_CLINIC_OR_DEPARTMENT_OTHER): Payer: BLUE CROSS/BLUE SHIELD

## 2016-12-28 DIAGNOSIS — D751 Secondary polycythemia: Secondary | ICD-10-CM

## 2016-12-28 DIAGNOSIS — D45 Polycythemia vera: Secondary | ICD-10-CM

## 2016-12-28 LAB — CBC WITH DIFFERENTIAL/PLATELET
BASO%: 0.6 % (ref 0.0–2.0)
BASOS ABS: 0.1 10*3/uL (ref 0.0–0.1)
EOS%: 2.5 % (ref 0.0–7.0)
Eosinophils Absolute: 0.5 10*3/uL (ref 0.0–0.5)
HEMATOCRIT: 59.2 % — AB (ref 38.4–49.9)
HEMOGLOBIN: 18.3 g/dL — AB (ref 13.0–17.1)
LYMPH#: 2.3 10*3/uL (ref 0.9–3.3)
LYMPH%: 10.8 % — ABNORMAL LOW (ref 14.0–49.0)
MCH: 22.7 pg — AB (ref 27.2–33.4)
MCHC: 30.9 g/dL — ABNORMAL LOW (ref 32.0–36.0)
MCV: 73.5 fL — ABNORMAL LOW (ref 79.3–98.0)
MONO#: 1 10*3/uL — AB (ref 0.1–0.9)
MONO%: 4.6 % (ref 0.0–14.0)
NEUT#: 17.2 10*3/uL — ABNORMAL HIGH (ref 1.5–6.5)
NEUT%: 81.5 % — AB (ref 39.0–75.0)
Platelets: 674 10*3/uL — ABNORMAL HIGH (ref 140–400)
RBC: 8.05 10*6/uL — ABNORMAL HIGH (ref 4.20–5.82)
RDW: 21.3 % — ABNORMAL HIGH (ref 11.0–14.6)
WBC: 21 10*3/uL — ABNORMAL HIGH (ref 4.0–10.3)
nRBC: 0 % (ref 0–0)

## 2016-12-28 NOTE — Patient Instructions (Signed)
Therapeutic Phlebotomy Therapeutic phlebotomy is the controlled removal of blood from a person's body for the purpose of treating a medical condition. The procedure is similar to donating blood. Usually, about a pint (470 mL, or 0.47L) of blood is removed. The average adult has 9-12 pints (4.3-5.7 L) of blood. Therapeutic phlebotomy may be used to treat the following medical conditions:  Hemochromatosis. This is a condition in which the blood contains too much iron.  Polycythemia vera. This is a condition in which the blood contains too many red blood cells.  Porphyria cutanea tarda. This is a disease in which an important part of hemoglobin is not made properly. It results in the buildup of abnormal amounts of porphyrins in the body.  Sickle cell disease. This is a condition in which the red blood cells form an abnormal crescent shape rather than a round shape.  Tell a health care provider about:  Any allergies you have.  All medicines you are taking, including vitamins, herbs, eye drops, creams, and over-the-counter medicines.  Any problems you or family members have had with anesthetic medicines.  Any blood disorders you have.  Any surgeries you have had.  Any medical conditions you have. What are the risks? Generally, this is a safe procedure. However, problems may occur, including:  Nausea or light-headedness.  Low blood pressure.  Soreness, bleeding, swelling, or bruising at the needle insertion site.  Infection.  What happens before the procedure?  Follow instructions from your health care provider about eating or drinking restrictions.  Ask your health care provider about changing or stopping your regular medicines. This is especially important if you are taking diabetes medicines or blood thinners.  Wear clothing with sleeves that can be raised above the elbow.  Plan to have someone take you home after the procedure.  You may have a blood sample taken. What  happens during the procedure?  A needle will be inserted into one of your veins.  Tubing and a collection bag will be attached to that needle.  Blood will flow through the needle and tubing into the collection bag.  You may be asked to open and close your hand slowly and continually during the entire collection.  After the specified amount of blood has been removed from your body, the collection bag and tubing will be clamped.  The needle will be removed from your vein.  Pressure will be held on the site of the needle insertion to stop the bleeding.  A bandage (dressing) will be placed over the needle insertion site. The procedure may vary among health care providers and hospitals. What happens after the procedure?  Your recovery will be assessed and monitored.  You can return to your normal activities as directed by your health care provider. This information is not intended to replace advice given to you by your health care provider. Make sure you discuss any questions you have with your health care provider. Document Released: 12/04/2010 Document Revised: 03/03/2016 Document Reviewed: 06/28/2014 Elsevier Interactive Patient Education  2018 Elsevier Inc.  

## 2017-01-17 ENCOUNTER — Telehealth: Payer: Self-pay | Admitting: Oncology

## 2017-01-17 NOTE — Telephone Encounter (Signed)
Faxed records to  rheumatology  °

## 2017-01-25 ENCOUNTER — Other Ambulatory Visit (HOSPITAL_BASED_OUTPATIENT_CLINIC_OR_DEPARTMENT_OTHER): Payer: BLUE CROSS/BLUE SHIELD

## 2017-01-25 ENCOUNTER — Ambulatory Visit (HOSPITAL_BASED_OUTPATIENT_CLINIC_OR_DEPARTMENT_OTHER): Payer: BLUE CROSS/BLUE SHIELD

## 2017-01-25 VITALS — BP 134/83 | HR 68 | Resp 17

## 2017-01-25 DIAGNOSIS — D751 Secondary polycythemia: Secondary | ICD-10-CM

## 2017-01-25 DIAGNOSIS — D45 Polycythemia vera: Secondary | ICD-10-CM

## 2017-01-25 LAB — CBC WITH DIFFERENTIAL/PLATELET
BASO%: 0.6 % (ref 0.0–2.0)
BASOS ABS: 0.1 10*3/uL (ref 0.0–0.1)
EOS ABS: 0.5 10*3/uL (ref 0.0–0.5)
EOS%: 2 % (ref 0.0–7.0)
HEMATOCRIT: 54.7 % — AB (ref 38.4–49.9)
HGB: 17 g/dL (ref 13.0–17.1)
LYMPH%: 8.3 % — AB (ref 14.0–49.0)
MCH: 22.4 pg — AB (ref 27.2–33.4)
MCHC: 31.1 g/dL — AB (ref 32.0–36.0)
MCV: 72.2 fL — AB (ref 79.3–98.0)
MONO#: 1 10*3/uL — ABNORMAL HIGH (ref 0.1–0.9)
MONO%: 4.5 % (ref 0.0–14.0)
NEUT#: 19.2 10*3/uL — ABNORMAL HIGH (ref 1.5–6.5)
NEUT%: 84.6 % — AB (ref 39.0–75.0)
PLATELETS: 804 10*3/uL — AB (ref 140–400)
RBC: 7.58 10*6/uL — AB (ref 4.20–5.82)
RDW: 20.2 % — ABNORMAL HIGH (ref 11.0–14.6)
WBC: 22.7 10*3/uL — AB (ref 4.0–10.3)
lymph#: 1.9 10*3/uL (ref 0.9–3.3)
nRBC: 0 % (ref 0–0)

## 2017-01-25 NOTE — Progress Notes (Signed)
Joshua Ryan. presents today for phlebotomy per MD orders. Phlebotomy procedure started at 1452 and ended at 1500 via 16 G to L forearm with 500 grams removed. Diet and nutrition offered. Patient observed for 30 minutes after procedure without any incident. Patient tolerated procedure well.

## 2017-01-25 NOTE — Patient Instructions (Signed)
Ferumoxytol injection What is this medicine? FERUMOXYTOL is an iron complex. Iron is used to make healthy red blood cells, which carry oxygen and nutrients throughout the body. This medicine is used to treat iron deficiency anemia in people with chronic kidney disease. This medicine may be used for other purposes; ask your health care provider or pharmacist if you have questions. COMMON BRAND NAME(S): Feraheme What should I tell my health care provider before I take this medicine? They need to know if you have any of these conditions: -anemia not caused by low iron levels -high levels of iron in the blood -magnetic resonance imaging (MRI) test scheduled -an unusual or allergic reaction to iron, other medicines, foods, dyes, or preservatives -pregnant or trying to get pregnant -breast-feeding How should I use this medicine? This medicine is for injection into a vein. It is given by a health care professional in a hospital or clinic setting. Talk to your pediatrician regarding the use of this medicine in children. Special care may be needed. Overdosage: If you think you have taken too much of this medicine contact a poison control center or emergency room at once. NOTE: This medicine is only for you. Do not share this medicine with others. What if I miss a dose? It is important not to miss your dose. Call your doctor or health care professional if you are unable to keep an appointment. What may interact with this medicine? This medicine may interact with the following medications: -other iron products This list may not describe all possible interactions. Give your health care provider a list of all the medicines, herbs, non-prescription drugs, or dietary supplements you use. Also tell them if you smoke, drink alcohol, or use illegal drugs. Some items may interact with your medicine. What should I watch for while using this medicine? Visit your doctor or healthcare professional regularly. Tell  your doctor or healthcare professional if your symptoms do not start to get better or if they get worse. You may need blood work done while you are taking this medicine. You may need to follow a special diet. Talk to your doctor. Foods that contain iron include: whole grains/cereals, dried fruits, beans, or peas, leafy green vegetables, and organ meats (liver, kidney). What side effects may I notice from receiving this medicine? Side effects that you should report to your doctor or health care professional as soon as possible: -allergic reactions like skin rash, itching or hives, swelling of the face, lips, or tongue -breathing problems -changes in blood pressure -feeling faint or lightheaded, falls -fever or chills -flushing, sweating, or hot feelings -swelling of the ankles or feet Side effects that usually do not require medical attention (report to your doctor or health care professional if they continue or are bothersome): -diarrhea -headache -nausea, vomiting -stomach pain This list may not describe all possible side effects. Call your doctor for medical advice about side effects. You may report side effects to FDA at 1-800-FDA-1088. Where should I keep my medicine? This drug is given in a hospital or clinic and will not be stored at home. NOTE: This sheet is a summary. It may not cover all possible information. If you have questions about this medicine, talk to your doctor, pharmacist, or health care provider.  2018 Elsevier/Gold Standard (2015-08-04 12:41:49) Therapeutic Phlebotomy, Care After Refer to this sheet in the next few weeks. These instructions provide you with information about caring for yourself after your procedure. Your health care provider may also give you more specific  instructions. Your treatment has been planned according to current medical practices, but problems sometimes occur. Call your health care provider if you have any problems or questions after your  procedure. What can I expect after the procedure? After the procedure, it is common to have:  Light-headedness or dizziness. You may feel faint.  Nausea.  Tiredness.  Follow these instructions at home: Activity  Return to your normal activities as directed by your health care provider. Most people can go back to their normal activities right away.  Avoid strenuous physical activity and heavy lifting or pulling for about 5 hours after the procedure. Do not lift anything that is heavier than 10 lb (4.5 kg).  Athletes should avoid strenuous exercise for at least 12 hours.  Change positions slowly for the remainder of the day. This will help to prevent light-headedness or fainting.  If you feel light-headed, lie down until the feeling goes away. Eating and drinking  Be sure to eat well-balanced meals for the next 24 hours.  Drink enough fluid to keep your urine clear or pale yellow.  Avoid drinking alcohol on the day that you had the procedure. Care of the Needle Insertion Site  Keep your bandage dry. You can remove the bandage after about 5 hours or as directed by your health care provider.  If you have bleeding from the needle insertion site, elevate your arm and press firmly on the site until the bleeding stops.  If you have bruising at the site, apply ice to the area: ? Put ice in a plastic bag. ? Place a towel between your skin and the bag. ? Leave the ice on for 20 minutes, 2-3 times a day for the first 24 hours.  If the swelling does not go away after 24 hours, apply a warm, moist washcloth to the area for 20 minutes, 2-3 times a day. General instructions  Avoid smoking for at least 30 minutes after the procedure.  Keep all follow-up visits as directed by your health care provider. It is important to continue with further therapeutic phlebotomy treatments as directed. Contact a health care provider if:  You have redness, swelling, or pain at the needle insertion  site.  You have fluid, blood, or pus coming from the needle insertion site.  You feel light-headed, dizzy, or nauseated, and the feeling does not go away.  You notice new bruising at the needle insertion site.  You feel weaker than normal.  You have a fever or chills. Get help right away if:  You have severe nausea or vomiting.  You have chest pain.  You have trouble breathing. This information is not intended to replace advice given to you by your health care provider. Make sure you discuss any questions you have with your health care provider. Document Released: 12/04/2010 Document Revised: 03/03/2016 Document Reviewed: 06/28/2014 Elsevier Interactive Patient Education  [employer].

## 2017-03-01 ENCOUNTER — Ambulatory Visit (HOSPITAL_BASED_OUTPATIENT_CLINIC_OR_DEPARTMENT_OTHER): Payer: BLUE CROSS/BLUE SHIELD

## 2017-03-01 ENCOUNTER — Telehealth: Payer: Self-pay | Admitting: Oncology

## 2017-03-01 ENCOUNTER — Ambulatory Visit (HOSPITAL_BASED_OUTPATIENT_CLINIC_OR_DEPARTMENT_OTHER): Payer: BLUE CROSS/BLUE SHIELD | Admitting: Oncology

## 2017-03-01 ENCOUNTER — Other Ambulatory Visit (HOSPITAL_BASED_OUTPATIENT_CLINIC_OR_DEPARTMENT_OTHER): Payer: BLUE CROSS/BLUE SHIELD

## 2017-03-01 VITALS — BP 123/86 | HR 94 | Temp 98.0°F | Resp 18 | Ht 70.0 in | Wt 175.9 lb

## 2017-03-01 DIAGNOSIS — D473 Essential (hemorrhagic) thrombocythemia: Secondary | ICD-10-CM

## 2017-03-01 DIAGNOSIS — D751 Secondary polycythemia: Secondary | ICD-10-CM

## 2017-03-01 DIAGNOSIS — D45 Polycythemia vera: Secondary | ICD-10-CM

## 2017-03-01 LAB — CBC WITH DIFFERENTIAL/PLATELET
BASO%: 0.9 % (ref 0.0–2.0)
BASOS ABS: 0.3 10*3/uL — AB (ref 0.0–0.1)
EOS%: 2.3 % (ref 0.0–7.0)
Eosinophils Absolute: 0.7 10*3/uL — ABNORMAL HIGH (ref 0.0–0.5)
HCT: 55.5 % — ABNORMAL HIGH (ref 38.4–49.9)
HGB: 17.2 g/dL — ABNORMAL HIGH (ref 13.0–17.1)
LYMPH#: 2.4 10*3/uL (ref 0.9–3.3)
LYMPH%: 8.2 % — ABNORMAL LOW (ref 14.0–49.0)
MCH: 21 pg — AB (ref 27.2–33.4)
MCHC: 30.9 g/dL — AB (ref 32.0–36.0)
MCV: 68 fL — ABNORMAL LOW (ref 79.3–98.0)
MONO#: 1.3 10*3/uL — ABNORMAL HIGH (ref 0.1–0.9)
MONO%: 4.6 % (ref 0.0–14.0)
NEUT#: 24.6 10*3/uL — ABNORMAL HIGH (ref 1.5–6.5)
NEUT%: 84 % — AB (ref 39.0–75.0)
Platelets: 940 10*3/uL — ABNORMAL HIGH (ref 140–400)
RBC: 8.16 10*6/uL — ABNORMAL HIGH (ref 4.20–5.82)
RDW: 20.5 % — ABNORMAL HIGH (ref 11.0–14.6)
WBC: 29.3 10*3/uL — ABNORMAL HIGH (ref 4.0–10.3)

## 2017-03-01 LAB — TECHNOLOGIST REVIEW: Technologist Review: 1

## 2017-03-01 MED ORDER — HYDROXYUREA 500 MG PO CAPS: 500.0000 mg | ORAL_CAPSULE | Freq: Every day | ORAL | 3 refills | Status: DC

## 2017-03-01 NOTE — Patient Instructions (Signed)

## 2017-03-01 NOTE — Progress Notes (Signed)
Phlebotomy started at 1528 with 16 gauge phlebotomy kit to the right AC. Phlebotomy ended at 1548, due to site clotting, with 436 grams of blood removed. Per Dr. Alen Blew 436 grams is adequate and no need to remove more. Snack and drink offered, patient refused snack at this time. Patient refused to stay for 30 min observation. Patient and vital signs stable upon discharge.  Marland Kitchen

## 2017-03-01 NOTE — Progress Notes (Signed)
Hematology and Oncology Follow Up Visit  Joshua Ryan [id number] 1946/03/07 71 y.o. 03/01/2017 3:18 PM [name], MDRoss, [name], MD   Principle Diagnosis: 71 year old gentleman with polycythemia vera diagnosed in May 2018. He presented with a hemoglobin of 19.4, white cell count of 19.7 and platelet count of 604. He has a JAK2 mutation.  Current therapy:  Phlebotomy on a monthly basis to keep his hemoglobin below 45. Hydroxyurea 500 mg daily to start on 03/01/2017.  Interim History: Joshua Ryan presents today for a follow-up visit. Since last visit, he has undergone phlebotomy on a monthly basis with improvement in his hemoglobin. He remains on prednisone for arthritic pain and currently receiving tapering doses. He denied any recent complications such as excessive fatigue, tiredness or headaches. He denied any recent thrombosis or bleeding episodes. He does report pain in his feet with ambulation at times.  He does not report any alteration of mental status. He does not report any fevers, chills, sweats or weight loss. He denied any fatigue or excessive tiredness. He does not report any chest pain, palpitation, orthopnea or leg edema. He does not report any cough, wheezing or hemoptysis. He does not report any dysphagia exertion. He does not report any nausea, vomiting or abdominal pain. He does not report any frequency urgency or hesitancy.  Remaining review of systems unremarkable  Medications: I have reviewed the patient's current medications.  Current Outpatient Prescriptions  Medication Sig Dispense Refill  . aspirin 81 MG tablet Take 81 mg by mouth daily.      . Calcium-Magnesium-Vitamin D (CALCIUM 500 PO) Take 1 tablet by mouth 2 (two) times daily.    . cetirizine (ZYRTEC) 10 MG tablet Take 10 mg by mouth daily.    . cholecalciferol (VITAMIN D) 1000 UNITS tablet Take 2,000 Units by mouth daily.     . famotidine (PEPCID) 20 MG tablet Take 20 mg by mouth 2 (two)  times daily.    . hydroxyurea (HYDREA) 500 MG capsule Take 1 capsule (500 mg total) by mouth daily. May take with food to minimize GI side effects. 90 capsule 3  . irbesartan (AVAPRO) 300 MG tablet Take 300 mg by mouth daily.  3  . KRILL OIL PO Take 500 mg by mouth daily.     . Saw Palmetto, Serenoa repens, (SAW PALMETTO PO) Take 450 mg by mouth daily.      No current facility-administered medications for this visit.      Allergies: No Known Allergies  Past Medical History, Surgical history, Social history, and Family History were reviewed and updated.  Physical Exam: Blood pressure 123/86, pulse 94, temperature 98 F (36.7 C), temperature source Oral, resp. rate 18, height 5\' 10"  (1.778 m), weight 175 lb 14.4 oz (79.8 kg), SpO2 97 %. ECOG: 0 General appearance: alert and cooperative appeared without distress. Head: Normocephalic, without obvious abnormality without oral ulcers or lesions. Neck: no adenopathy Lymph nodes: Cervical, supraclavicular, and axillary nodes normal. Heart:regular rate and rhythm, S1, S2 normal, no murmur, click, rub or gallop Lung:chest clear, no wheezing, rales, normal symmetric air entry Abdomin: soft, non-tender, without masses or organomegaly EXT:no erythema, induration, or nodules   Lab Results: Lab Results  Component Value Date   WBC 29.3 (H) 03/01/2017   HGB 17.2 (H) 03/01/2017   HCT 55.5 (H) 03/01/2017   MCV 68.0 (L) 03/01/2017   PLT 940 (H) 03/01/2017     Chemistry      Component Value Date/Time   NA 141  11/29/2016 1458   K 4.5 11/29/2016 1458   CO2 26 11/29/2016 1458   BUN 20.3 11/29/2016 1458   CREATININE 1.1 11/29/2016 1458      Component Value Date/Time   CALCIUM 9.9 11/29/2016 1458   ALKPHOS 83 11/29/2016 1458   AST 19 11/29/2016 1458   ALT 22 11/29/2016 1458   BILITOT 1.63 (H) 11/29/2016 1458        Impression and Plan:  1. Polycythemia: This was detected on a CBC in April 2018 after presenting with a hemoglobin of  19.3 and an MCV of 69. His workup revealed a positive JAK2 mutation and normal as are patent. These findings suggest polycythemia vera and currently receiving phlebotomy.  The natural course of this disease was discussed today with the patient and treatment approach was also reviewed. The plan is to continue with phlebotomy to control his hemoglobin moving forward. His target will be hemoglobin of less than 45. He is tolerating monthly phlebotomy without complications at this time.  Given his thrombocytosis as well, I discussed the risks and benefits of adding hydroxyurea. Case associated with this medication was reviewed today which include cytopenias, arthralgias, mouth sores among others. We will start at 500 mg daily and titrate to keep his platelet count below 600.  2. Leukocytosis and thrombocytosis: reactive related to prednisone and also related to myeloproliferative disorder. We will continue to monitor at this time. I anticipate this will improve on hydroxyurea.  3. Follow-up: Will continue be on a monthly basis to receive phlebotomy and M.D. follow-up in 3 months.    Zola Button, MD 8/17/20183:18 PM

## 2017-03-01 NOTE — Telephone Encounter (Signed)
Gave patient avs report and appointments for September thru December. Per 8/17 los November lab/fu/phleb requested for 11/15. Per patient scheduled for Friday 11/16 instead.

## 2017-03-04 ENCOUNTER — Other Ambulatory Visit: Payer: Self-pay | Admitting: Family Medicine

## 2017-03-04 DIAGNOSIS — M79605 Pain in left leg: Secondary | ICD-10-CM

## 2017-03-04 DIAGNOSIS — M79671 Pain in right foot: Secondary | ICD-10-CM

## 2017-03-04 DIAGNOSIS — M79672 Pain in left foot: Secondary | ICD-10-CM

## 2017-03-04 DIAGNOSIS — M79604 Pain in right leg: Secondary | ICD-10-CM

## 2017-03-08 ENCOUNTER — Ambulatory Visit
Admission: RE | Admit: 2017-03-08 | Discharge: 2017-03-08 | Disposition: A | Discharge status: Home / Self Care | Payer: BLUE CROSS/BLUE SHIELD | Source: Ambulatory Visit | Attending: Family Medicine | Admitting: Family Medicine

## 2017-03-08 DIAGNOSIS — M79672 Pain in left foot: Secondary | ICD-10-CM

## 2017-03-08 DIAGNOSIS — M79605 Pain in left leg: Secondary | ICD-10-CM

## 2017-03-08 DIAGNOSIS — M79604 Pain in right leg: Secondary | ICD-10-CM

## 2017-03-08 DIAGNOSIS — M79671 Pain in right foot: Secondary | ICD-10-CM

## 2017-03-29 ENCOUNTER — Other Ambulatory Visit (HOSPITAL_BASED_OUTPATIENT_CLINIC_OR_DEPARTMENT_OTHER): Payer: BLUE CROSS/BLUE SHIELD

## 2017-03-29 ENCOUNTER — Ambulatory Visit: Payer: BLUE CROSS/BLUE SHIELD

## 2017-03-29 DIAGNOSIS — D473 Essential (hemorrhagic) thrombocythemia: Secondary | ICD-10-CM | POA: Diagnosis not present

## 2017-03-29 DIAGNOSIS — D751 Secondary polycythemia: Secondary | ICD-10-CM

## 2017-03-29 DIAGNOSIS — D45 Polycythemia vera: Secondary | ICD-10-CM

## 2017-03-29 LAB — CBC WITH DIFFERENTIAL/PLATELET
BASO%: 1.4 % (ref 0.0–2.0)
BASOS ABS: 0.2 10*3/uL — AB (ref 0.0–0.1)
EOS%: 2.8 % (ref 0.0–7.0)
Eosinophils Absolute: 0.4 10*3/uL (ref 0.0–0.5)
HCT: 56 % — ABNORMAL HIGH (ref 38.4–49.9)
HEMOGLOBIN: 17 g/dL (ref 13.0–17.1)
LYMPH#: 1.6 10*3/uL (ref 0.9–3.3)
LYMPH%: 11.9 % — AB (ref 14.0–49.0)
MCH: 22.1 pg — AB (ref 27.2–33.4)
MCHC: 30.4 g/dL — AB (ref 32.0–36.0)
MCV: 72.7 fL — ABNORMAL LOW (ref 79.3–98.0)
MONO#: 0.9 10*3/uL (ref 0.1–0.9)
MONO%: 6.6 % (ref 0.0–14.0)
NEUT#: 10.6 10*3/uL — ABNORMAL HIGH (ref 1.5–6.5)
NEUT%: 77.3 % — AB (ref 39.0–75.0)
Platelets: 413 10*3/uL — ABNORMAL HIGH (ref 140–400)
RBC: 7.7 10*6/uL — ABNORMAL HIGH (ref 4.20–5.82)
RDW: 24.4 % — ABNORMAL HIGH (ref 11.0–14.6)
WBC: 13.8 10*3/uL — ABNORMAL HIGH (ref 4.0–10.3)
nRBC: 0 % (ref 0–0)

## 2017-03-29 LAB — COMPREHENSIVE METABOLIC PANEL
ALBUMIN: 3.7 g/dL (ref 3.5–5.0)
ALK PHOS: 70 U/L (ref 40–150)
ALT: 23 U/L (ref 0–55)
AST: 19 U/L (ref 5–34)
Anion Gap: 11 mEq/L (ref 3–11)
BUN: 18 mg/dL (ref 7.0–26.0)
CALCIUM: 9.4 mg/dL (ref 8.4–10.4)
CHLORIDE: 106 meq/L (ref 98–109)
CO2: 24 mEq/L (ref 22–29)
Creatinine: 1 mg/dL (ref 0.7–1.3)
EGFR: 72 mL/min/{1.73_m2} — AB (ref >90)
GLUCOSE: 85 mg/dL (ref 70–140)
POTASSIUM: 4.2 meq/L (ref 3.5–5.1)
SODIUM: 142 meq/L (ref 136–145)
Total Bilirubin: 0.96 mg/dL (ref 0.20–1.20)
Total Protein: 6.6 g/dL (ref 6.4–8.3)

## 2017-03-29 NOTE — Patient Instructions (Signed)

## 2017-03-29 NOTE — Progress Notes (Signed)
Pt tolerated phlebotomy well. Drinking fluids after procedure.

## 2017-04-26 ENCOUNTER — Other Ambulatory Visit (HOSPITAL_BASED_OUTPATIENT_CLINIC_OR_DEPARTMENT_OTHER): Payer: BLUE CROSS/BLUE SHIELD

## 2017-04-26 ENCOUNTER — Ambulatory Visit (HOSPITAL_BASED_OUTPATIENT_CLINIC_OR_DEPARTMENT_OTHER): Payer: BLUE CROSS/BLUE SHIELD

## 2017-04-26 VITALS — BP 106/70 | HR 97 | Temp 98.9°F | Resp 17

## 2017-04-26 DIAGNOSIS — D751 Secondary polycythemia: Secondary | ICD-10-CM

## 2017-04-26 DIAGNOSIS — D45 Polycythemia vera: Secondary | ICD-10-CM

## 2017-04-26 LAB — COMPREHENSIVE METABOLIC PANEL
ALT: 26 U/L (ref 0–55)
AST: 22 U/L (ref 5–34)
Albumin: 4.2 g/dL (ref 3.5–5.0)
Alkaline Phosphatase: 75 U/L (ref 40–150)
Anion Gap: 9 mEq/L (ref 3–11)
BUN: 17.1 mg/dL (ref 7.0–26.0)
CALCIUM: 9.4 mg/dL (ref 8.4–10.4)
CHLORIDE: 105 meq/L (ref 98–109)
CO2: 27 meq/L (ref 22–29)
Creatinine: 1.1 mg/dL (ref 0.7–1.3)
EGFR: >60 mL/min/{1.73_m2} (ref >60)
Glucose: 130 mg/dl (ref 70–140)
Potassium: 4.6 mEq/L (ref 3.5–5.1)
Sodium: 141 mEq/L (ref 136–145)
Total Bilirubin: 1.36 mg/dL — ABNORMAL HIGH (ref 0.20–1.20)
Total Protein: 7 g/dL (ref 6.4–8.3)

## 2017-04-26 LAB — CBC WITH DIFFERENTIAL/PLATELET
BASO%: 0.6 % (ref 0.0–2.0)
Basophils Absolute: 0.1 10*3/uL (ref 0.0–0.1)
EOS%: 1 % (ref 0.0–7.0)
Eosinophils Absolute: 0.1 10*3/uL (ref 0.0–0.5)
HEMATOCRIT: 57.5 % — AB (ref 38.4–49.9)
HGB: 17.8 g/dL — ABNORMAL HIGH (ref 13.0–17.1)
LYMPH#: 0.9 10*3/uL (ref 0.9–3.3)
LYMPH%: 7.2 % — AB (ref 14.0–49.0)
MCH: 23.6 pg — ABNORMAL LOW (ref 27.2–33.4)
MCHC: 31 g/dL — AB (ref 32.0–36.0)
MCV: 76.4 fL — ABNORMAL LOW (ref 79.3–98.0)
MONO#: 0.6 10*3/uL (ref 0.1–0.9)
MONO%: 4.8 % (ref 0.0–14.0)
NEUT%: 86.4 % — ABNORMAL HIGH (ref 39.0–75.0)
NEUTROS ABS: 10.6 10*3/uL — AB (ref 1.5–6.5)
Platelets: 366 10*3/uL (ref 140–400)
RBC: 7.53 10*6/uL — ABNORMAL HIGH (ref 4.20–5.82)
RDW: 27.2 % — AB (ref 11.0–14.6)
WBC: 12.3 10*3/uL — AB (ref 4.0–10.3)
nRBC: 0 % (ref 0–0)

## 2017-04-26 NOTE — Progress Notes (Signed)
Phlebotomy started with 16 G at 1508 in R AC, stopped at 1514 withdrawing 525 grams. Pt ate and drank post phlebotomy and stayed for 30 minute observation. VSS at D/C.

## 2017-04-26 NOTE — Patient Instructions (Signed)

## 2017-05-31 ENCOUNTER — Ambulatory Visit (HOSPITAL_BASED_OUTPATIENT_CLINIC_OR_DEPARTMENT_OTHER): Payer: BLUE CROSS/BLUE SHIELD | Admitting: Oncology

## 2017-05-31 ENCOUNTER — Ambulatory Visit (HOSPITAL_BASED_OUTPATIENT_CLINIC_OR_DEPARTMENT_OTHER): Payer: BLUE CROSS/BLUE SHIELD

## 2017-05-31 ENCOUNTER — Other Ambulatory Visit (HOSPITAL_BASED_OUTPATIENT_CLINIC_OR_DEPARTMENT_OTHER): Payer: BLUE CROSS/BLUE SHIELD

## 2017-05-31 ENCOUNTER — Telehealth: Payer: Self-pay | Admitting: Oncology

## 2017-05-31 VITALS — BP 119/71

## 2017-05-31 VITALS — BP 138/81 | HR 71 | Temp 98.5°F | Resp 18 | Ht 70.0 in | Wt 179.5 lb

## 2017-05-31 DIAGNOSIS — D72829 Elevated white blood cell count, unspecified: Secondary | ICD-10-CM | POA: Diagnosis not present

## 2017-05-31 DIAGNOSIS — D45 Polycythemia vera: Secondary | ICD-10-CM

## 2017-05-31 DIAGNOSIS — D473 Essential (hemorrhagic) thrombocythemia: Secondary | ICD-10-CM | POA: Diagnosis not present

## 2017-05-31 LAB — CBC WITH DIFFERENTIAL/PLATELET
BASO%: 0.9 % (ref 0.0–2.0)
Basophils Absolute: 0.1 10*3/uL (ref 0.0–0.1)
EOS%: 2.2 % (ref 0.0–7.0)
Eosinophils Absolute: 0.3 10*3/uL (ref 0.0–0.5)
HEMATOCRIT: 53.1 % — AB (ref 38.4–49.9)
HEMOGLOBIN: 16.3 g/dL (ref 13.0–17.1)
LYMPH#: 2 10*3/uL (ref 0.9–3.3)
LYMPH%: 16.8 % (ref 14.0–49.0)
MCH: 25.7 pg — AB (ref 27.2–33.4)
MCHC: 30.7 g/dL — ABNORMAL LOW (ref 32.0–36.0)
MCV: 83.6 fL (ref 79.3–98.0)
MONO#: 1 10*3/uL — ABNORMAL HIGH (ref 0.1–0.9)
MONO%: 9 % (ref 0.0–14.0)
NEUT%: 71.1 % (ref 39.0–75.0)
NEUTROS ABS: 8.3 10*3/uL — AB (ref 1.5–6.5)
Platelets: 406 10*3/uL — ABNORMAL HIGH (ref 140–400)
RBC: 6.35 10*6/uL — ABNORMAL HIGH (ref 4.20–5.82)
RDW: 24.8 % — AB (ref 11.0–14.6)
WBC: 11.6 10*3/uL — AB (ref 4.0–10.3)

## 2017-05-31 LAB — COMPREHENSIVE METABOLIC PANEL
ALT: 23 U/L (ref 0–55)
ANION GAP: 9 meq/L (ref 3–11)
AST: 19 U/L (ref 5–34)
Albumin: 4.1 g/dL (ref 3.5–5.0)
Alkaline Phosphatase: 77 U/L (ref 40–150)
BILIRUBIN TOTAL: 1.15 mg/dL (ref 0.20–1.20)
BUN: 19 mg/dL (ref 7.0–26.0)
CALCIUM: 9.3 mg/dL (ref 8.4–10.4)
CHLORIDE: 108 meq/L (ref 98–109)
CO2: 25 meq/L (ref 22–29)
CREATININE: 1.1 mg/dL (ref 0.7–1.3)
EGFR: >60 mL/min/{1.73_m2} (ref >60)
Glucose: 58 mg/dl — ABNORMAL LOW (ref 70–140)
Potassium: 4.1 mEq/L (ref 3.5–5.1)
SODIUM: 142 meq/L (ref 136–145)
TOTAL PROTEIN: 7 g/dL (ref 6.4–8.3)

## 2017-05-31 NOTE — Telephone Encounter (Signed)
Gave patient avs and calendar with appts per 11/16 los.  °

## 2017-05-31 NOTE — Progress Notes (Signed)
Hematology and Oncology Follow Up Visit  Joshua Ryan [id number] 1945/10/16 71 y.o. 05/31/2017 1:11 PM Joshua Ryan, MDRoss, Joshua Luo, MD   Principle Diagnosis: 71 year old gentleman with polycythemia vera diagnosed in May 2018. He presented with a hemoglobin of 19.4, white cell count of 19.7 and platelet count of 604. He has a JAK2 mutation.  Current therapy:  Phlebotomy on a monthly basis to keep his hemoglobin below 45. Hydroxyurea 500 mg daily started on 03/01/2017.  Interim History: [name] presents today for a follow-up visit. Since last visit, he reports no changes in his health.  He continues to receive phlebotomy monthly basis with issues.  He denied any recent complications such as excessive fatigue, tiredness or headaches. He denied any recent thrombosis or bleeding episodes. He does report pain in his feet with ambulation at times.  He also reports no issues with dysuria which he has been taking on a daily basis.  He does not report any alteration of mental status. He does not report any fevers, chills, sweats or weight loss. He denied any fatigue or excessive tiredness. He does not report any chest pain, palpitation, orthopnea or leg edema. He does not report any cough, wheezing or hemoptysis. He does not report any dysphagia exertion. He does not report any nausea, vomiting or abdominal pain. He does not report any frequency urgency or hesitancy.  Remaining review of systems unremarkable  Medications: I have reviewed the patient's current medications.  Current Outpatient Medications  Medication Sig Dispense Refill  . aspirin 81 MG tablet Take 81 mg by mouth daily.      . Calcium-Magnesium-Vitamin D (CALCIUM 500 PO) Take 1 tablet by mouth 2 (two) times daily.    . cetirizine (ZYRTEC) 10 MG tablet Take 10 mg by mouth daily.    . cholecalciferol (VITAMIN D) 1000 UNITS tablet Take 2,000 Units by mouth daily.     . famotidine (PEPCID) 20 MG tablet Take 20 mg  by mouth 2 (two) times daily.    . hydroxyurea (HYDREA) 500 MG capsule Take 1 capsule (500 mg total) by mouth daily. May take with food to minimize GI side effects. 90 capsule 3  . irbesartan (AVAPRO) 300 MG tablet Take 300 mg by mouth daily.  3  . KRILL OIL PO Take 500 mg by mouth daily.     . Saw Palmetto, Serenoa repens, (SAW PALMETTO PO) Take 450 mg by mouth daily.      No current facility-administered medications for this visit.      Allergies: No Known Allergies  Past Medical History, Surgical history, Social history, and Family History were reviewed and updated.  Physical Exam: Blood pressure 138/81, pulse 71, temperature 98.5 F (36.9 C), temperature source Oral, resp. rate 18, height 5\' 10"  (1.778 m), weight 179 lb 8 oz (81.4 kg), SpO2 97 %. ECOG: 0 General appearance: Well-appearing gentleman without distress. Head: Normocephalic, without obvious abnormality. No ulcers or lesions. Neck: no adenopathy or masses. Lymph nodes: Cervical, supraclavicular, and axillary nodes normal. Heart:regular rate and rhythm, S1, S2 normal, no murmur, click, rub or gallop Lung:chest clear, no wheezing, rales, normal symmetric air entry Abdomin: soft, non-tender, without masses or organomegaly no shifting dullness or ascites. EXT:no erythema, induration, or nodules   Lab Results: Lab Results  Component Value Date   WBC 11.6 (H) 05/31/2017   HGB 16.3 05/31/2017   HCT 53.1 (H) 05/31/2017   MCV 83.6 05/31/2017   PLT 406 (H) 05/31/2017     Chemistry  Component Value Date/Time   NA 141 04/26/2017 1449   K 4.6 04/26/2017 1449   CO2 27 04/26/2017 1449   BUN 17.1 04/26/2017 1449   CREATININE 1.1 04/26/2017 1449      Component Value Date/Time   CALCIUM 9.4 04/26/2017 1449   ALKPHOS 75 04/26/2017 1449   AST 22 04/26/2017 1449   ALT 26 04/26/2017 1449   BILITOT 1.36 (H) 04/26/2017 1449        Impression and Plan:  1. Polycythemia: This was detected on a CBC in April 2018  after presenting with a hemoglobin of 19.3 and an MCV of 69. His workup revealed a positive JAK2 mutation and normal as are patent. These findings suggest polycythemia vera.   He is currently receiving phlebotomy to keep his hematocrit less than 45.  Risks and benefits of this approach is discussed he is agreeable to continue.  2. Leukocytosis and thrombocytosis: reactive related to prednisone and also related to myeloproliferative disorder.   He is currently on hydroxyurea 500 mg to keep his platelet count close to 400,000.  Plan is to keep at the same dose and schedule for the time being.  3. Thrombosis prophylaxis: He is currently on aspirin without any history of thrombosis previously.  4. Follow-up: Will continue be on a monthly basis to receive phlebotomy and M.D. follow-up in 4 months.    Zola Button, MD 11/16/20181:11 PM

## 2017-05-31 NOTE — Patient Instructions (Signed)

## 2017-05-31 NOTE — Progress Notes (Signed)
Phlebotomy indicated with Hct of 53.1. 16g to LAC initiated at 1420. Needled secured. At 1424, needle fell from patient's arm with 275ml removed. Phlebotomy restarted to RAC with 16g and 382ml removed between 1425 - 1430 without incident. Patient tolerated procedure well. No complaints. Snack and drink offered and drink accepted. 17min observation initiated. Patient declined to stay full 3mins. Observed for 60mins. VSS and patient discharged to home in stable in condition.    Wylene Simmer, BSN, RN 05/31/2017

## 2017-06-28 ENCOUNTER — Ambulatory Visit (HOSPITAL_BASED_OUTPATIENT_CLINIC_OR_DEPARTMENT_OTHER): Payer: BLUE CROSS/BLUE SHIELD

## 2017-06-28 ENCOUNTER — Other Ambulatory Visit (HOSPITAL_BASED_OUTPATIENT_CLINIC_OR_DEPARTMENT_OTHER): Payer: BLUE CROSS/BLUE SHIELD

## 2017-06-28 VITALS — BP 106/65 | Temp 98.3°F | Resp 16

## 2017-06-28 DIAGNOSIS — D45 Polycythemia vera: Secondary | ICD-10-CM

## 2017-06-28 LAB — COMPREHENSIVE METABOLIC PANEL
ALK PHOS: 74 U/L (ref 40–150)
ALT: 19 U/L (ref 0–55)
ANION GAP: 10 meq/L (ref 3–11)
AST: 19 U/L (ref 5–34)
Albumin: 4.2 g/dL (ref 3.5–5.0)
BILIRUBIN TOTAL: 1.01 mg/dL (ref 0.20–1.20)
BUN: 24 mg/dL (ref 7.0–26.0)
CALCIUM: 9.4 mg/dL (ref 8.4–10.4)
CO2: 24 mEq/L (ref 22–29)
CREATININE: 1.2 mg/dL (ref 0.7–1.3)
Chloride: 105 mEq/L (ref 98–109)
EGFR: >60 mL/min/{1.73_m2} (ref >60)
Glucose: 104 mg/dl (ref 70–140)
Potassium: 5.5 mEq/L — ABNORMAL HIGH (ref 3.5–5.1)
Sodium: 139 mEq/L (ref 136–145)
TOTAL PROTEIN: 7 g/dL (ref 6.4–8.3)

## 2017-06-28 LAB — CBC WITH DIFFERENTIAL/PLATELET
BASO%: 0.9 % (ref 0.0–2.0)
Basophils Absolute: 0.1 10*3/uL (ref 0.0–0.1)
EOS ABS: 0.1 10*3/uL (ref 0.0–0.5)
EOS%: 0.9 % (ref 0.0–7.0)
HEMATOCRIT: 50.3 % — AB (ref 38.4–49.9)
HGB: 15.8 g/dL (ref 13.0–17.1)
LYMPH#: 1 10*3/uL (ref 0.9–3.3)
LYMPH%: 10.1 % — ABNORMAL LOW (ref 14.0–49.0)
MCH: 26.7 pg — ABNORMAL LOW (ref 27.2–33.4)
MCHC: 31.5 g/dL — ABNORMAL LOW (ref 32.0–36.0)
MCV: 84.6 fL (ref 79.3–98.0)
MONO#: 0.6 10*3/uL (ref 0.1–0.9)
MONO%: 6 % (ref 0.0–14.0)
NEUT%: 82.1 % — AB (ref 39.0–75.0)
NEUTROS ABS: 8.3 10*3/uL — AB (ref 1.5–6.5)
PLATELETS: 381 10*3/uL (ref 140–400)
RBC: 5.94 10*6/uL — ABNORMAL HIGH (ref 4.20–5.82)
RDW: 20.6 % — ABNORMAL HIGH (ref 11.0–14.6)
WBC: 10.1 10*3/uL (ref 4.0–10.3)

## 2017-06-28 NOTE — Patient Instructions (Signed)
Therapeutic Phlebotomy Therapeutic phlebotomy is the controlled removal of blood from a person's body for the purpose of treating a medical condition. The procedure is similar to donating blood. Usually, about a pint (470 mL, or 0.47L) of blood is removed. The average adult has 9-12 pints (4.3-5.7 L) of blood. Therapeutic phlebotomy may be used to treat the following medical conditions:  Hemochromatosis. This is a condition in which the blood contains too much iron.  Polycythemia vera. This is a condition in which the blood contains too many red blood cells.  Porphyria cutanea tarda. This is a disease in which an important part of hemoglobin is not made properly. It results in the buildup of abnormal amounts of porphyrins in the body.  Sickle cell disease. This is a condition in which the red blood cells form an abnormal crescent shape rather than a round shape.  Tell a health care provider about:  Any allergies you have.  All medicines you are taking, including vitamins, herbs, eye drops, creams, and over-the-counter medicines.  Any problems you or family members have had with anesthetic medicines.  Any blood disorders you have.  Any surgeries you have had.  Any medical conditions you have. What are the risks? Generally, this is a safe procedure. However, problems may occur, including:  Nausea or light-headedness.  Low blood pressure.  Soreness, bleeding, swelling, or bruising at the needle insertion site.  Infection.  What happens before the procedure?  Follow instructions from your health care provider about eating or drinking restrictions.  Ask your health care provider about changing or stopping your regular medicines. This is especially important if you are taking diabetes medicines or blood thinners.  Wear clothing with sleeves that can be raised above the elbow.  Plan to have someone take you home after the procedure.  You may have a blood sample taken. What  happens during the procedure?  A needle will be inserted into one of your veins.  Tubing and a collection bag will be attached to that needle.  Blood will flow through the needle and tubing into the collection bag.  You may be asked to open and close your hand slowly and continually during the entire collection.  After the specified amount of blood has been removed from your body, the collection bag and tubing will be clamped.  The needle will be removed from your vein.  Pressure will be held on the site of the needle insertion to stop the bleeding.  A bandage (dressing) will be placed over the needle insertion site. The procedure may vary among health care providers and hospitals. What happens after the procedure?  Your recovery will be assessed and monitored.  You can return to your normal activities as directed by your health care provider. This information is not intended to replace advice given to you by your health care provider. Make sure you discuss any questions you have with your health care provider. Document Released: 12/04/2010 Document Revised: 03/03/2016 Document Reviewed: 06/28/2014 Elsevier Interactive Patient Education  2018 Elsevier Inc.  

## 2017-06-28 NOTE — Progress Notes (Signed)
Noted hyperkalemia. Patient denies CP, dyspnea, and/or palpitations. Dr. Alen Blew aware. No new orders received.

## 2017-07-26 ENCOUNTER — Inpatient Hospital Stay: Payer: BLUE CROSS/BLUE SHIELD

## 2017-07-26 ENCOUNTER — Inpatient Hospital Stay: Payer: BLUE CROSS/BLUE SHIELD | Attending: Oncology

## 2017-07-26 VITALS — BP 126/85 | HR 54 | Temp 98.7°F | Resp 17

## 2017-07-26 DIAGNOSIS — D45 Polycythemia vera: Secondary | ICD-10-CM

## 2017-07-26 DIAGNOSIS — Z79899 Other long term (current) drug therapy: Secondary | ICD-10-CM | POA: Diagnosis not present

## 2017-07-26 DIAGNOSIS — D72829 Elevated white blood cell count, unspecified: Secondary | ICD-10-CM | POA: Insufficient documentation

## 2017-07-26 LAB — CBC WITH DIFFERENTIAL/PLATELET
BASOS ABS: 0.1 10*3/uL (ref 0.0–0.1)
Basophils Relative: 1 %
EOS PCT: 1 %
Eosinophils Absolute: 0.1 10*3/uL (ref 0.0–0.5)
HEMATOCRIT: 50 % — AB (ref 38.4–49.9)
Hemoglobin: 15.5 g/dL (ref 13.0–17.1)
LYMPHS ABS: 0.9 10*3/uL (ref 0.9–3.3)
LYMPHS PCT: 8 %
MCH: 28 pg (ref 27.2–33.4)
MCHC: 31 g/dL — ABNORMAL LOW (ref 32.0–36.0)
MCV: 90.3 fL (ref 79.3–98.0)
MONO ABS: 0.7 10*3/uL (ref 0.1–0.9)
Monocytes Relative: 6 %
NEUTROS ABS: 9.5 10*3/uL — AB (ref 1.5–6.5)
Neutrophils Relative %: 84 %
Platelets: 350 10*3/uL (ref 140–400)
RBC: 5.54 MIL/uL (ref 4.20–5.82)
RDW: 17.8 % — ABNORMAL HIGH (ref 11.0–15.6)
WBC: 11.3 10*3/uL — AB (ref 4.0–10.3)

## 2017-07-26 LAB — COMPREHENSIVE METABOLIC PANEL
ALBUMIN: 4.3 g/dL (ref 3.5–5.0)
ALT: 18 U/L (ref 0–55)
ANION GAP: 8 (ref 3–11)
AST: 19 U/L (ref 5–34)
Alkaline Phosphatase: 75 U/L (ref 40–150)
BUN: 17 mg/dL (ref 7–26)
CHLORIDE: 105 mmol/L (ref 98–109)
CO2: 27 mmol/L (ref 22–29)
Calcium: 9.4 mg/dL (ref 8.4–10.4)
Creatinine, Ser: 1.13 mg/dL (ref 0.70–1.30)
GFR calc Af Amer: >60 mL/min (ref >60)
GFR calc non Af Amer: >60 mL/min (ref >60)
GLUCOSE: 104 mg/dL (ref 70–140)
Potassium: 5.1 mmol/L (ref 3.5–5.1)
Sodium: 140 mmol/L (ref 136–145)
Total Bilirubin: 1 mg/dL (ref 0.2–1.2)
Total Protein: 7 g/dL (ref 6.4–8.3)

## 2017-07-26 NOTE — Patient Instructions (Signed)
Therapeutic Phlebotomy Therapeutic phlebotomy is the controlled removal of blood from a person's body for the purpose of treating a medical condition. The procedure is similar to donating blood. Usually, about a pint (470 mL, or 0.47L) of blood is removed. The average adult has 9-12 pints (4.3-5.7 L) of blood. Therapeutic phlebotomy may be used to treat the following medical conditions:  Hemochromatosis. This is a condition in which the blood contains too much iron.  Polycythemia vera. This is a condition in which the blood contains too many red blood cells.  Porphyria cutanea tarda. This is a disease in which an important part of hemoglobin is not made properly. It results in the buildup of abnormal amounts of porphyrins in the body.  Sickle cell disease. This is a condition in which the red blood cells form an abnormal crescent shape rather than a round shape.  Tell a health care provider about:  Any allergies you have.  All medicines you are taking, including vitamins, herbs, eye drops, creams, and over-the-counter medicines.  Any problems you or family members have had with anesthetic medicines.  Any blood disorders you have.  Any surgeries you have had.  Any medical conditions you have. What are the risks? Generally, this is a safe procedure. However, problems may occur, including:  Nausea or light-headedness.  Low blood pressure.  Soreness, bleeding, swelling, or bruising at the needle insertion site.  Infection.  What happens before the procedure?  Follow instructions from your health care provider about eating or drinking restrictions.  Ask your health care provider about changing or stopping your regular medicines. This is especially important if you are taking diabetes medicines or blood thinners.  Wear clothing with sleeves that can be raised above the elbow.  Plan to have someone take you home after the procedure.  You may have a blood sample taken. What  happens during the procedure?  A needle will be inserted into one of your veins.  Tubing and a collection bag will be attached to that needle.  Blood will flow through the needle and tubing into the collection bag.  You may be asked to open and close your hand slowly and continually during the entire collection.  After the specified amount of blood has been removed from your body, the collection bag and tubing will be clamped.  The needle will be removed from your vein.  Pressure will be held on the site of the needle insertion to stop the bleeding.  A bandage (dressing) will be placed over the needle insertion site. The procedure may vary among health care providers and hospitals. What happens after the procedure?  Your recovery will be assessed and monitored.  You can return to your normal activities as directed by your health care provider. This information is not intended to replace advice given to you by your health care provider. Make sure you discuss any questions you have with your health care provider. Document Released: 12/04/2010 Document Revised: 03/03/2016 Document Reviewed: 06/28/2014 Elsevier Interactive Patient Education  2018 Elsevier Inc.  

## 2017-07-26 NOTE — Progress Notes (Signed)
Pt advised he does not want to stay x 30 minutes.

## 2017-08-30 ENCOUNTER — Inpatient Hospital Stay: Payer: BLUE CROSS/BLUE SHIELD | Attending: Oncology

## 2017-08-30 ENCOUNTER — Inpatient Hospital Stay: Payer: BLUE CROSS/BLUE SHIELD

## 2017-08-30 VITALS — BP 111/92 | HR 86 | Temp 98.3°F | Resp 17

## 2017-08-30 DIAGNOSIS — D72829 Elevated white blood cell count, unspecified: Secondary | ICD-10-CM | POA: Diagnosis not present

## 2017-08-30 DIAGNOSIS — Z79899 Other long term (current) drug therapy: Secondary | ICD-10-CM | POA: Diagnosis not present

## 2017-08-30 DIAGNOSIS — D45 Polycythemia vera: Secondary | ICD-10-CM

## 2017-08-30 LAB — CBC WITH DIFFERENTIAL/PLATELET
Basophils Absolute: 0.1 10*3/uL (ref 0.0–0.1)
Basophils Relative: 1 %
EOS PCT: 1 %
Eosinophils Absolute: 0.1 10*3/uL (ref 0.0–0.5)
HEMATOCRIT: 47.8 % (ref 38.4–49.9)
Hemoglobin: 15.3 g/dL (ref 13.0–17.1)
LYMPHS ABS: 0.8 10*3/uL — AB (ref 0.9–3.3)
LYMPHS PCT: 7 %
MCH: 28 pg (ref 27.2–33.4)
MCHC: 32.1 g/dL (ref 32.0–36.0)
MCV: 87.4 fL (ref 79.3–98.0)
MONO ABS: 0.7 10*3/uL (ref 0.1–0.9)
Monocytes Relative: 6 %
Neutro Abs: 9.8 10*3/uL — ABNORMAL HIGH (ref 1.5–6.5)
Neutrophils Relative %: 85 %
PLATELETS: 419 10*3/uL — AB (ref 140–400)
RBC: 5.47 MIL/uL (ref 4.20–5.82)
RDW: 17.5 % — AB (ref 11.0–14.6)
WBC: 11.5 10*3/uL — ABNORMAL HIGH (ref 4.0–10.3)

## 2017-08-30 LAB — COMPREHENSIVE METABOLIC PANEL
ALBUMIN: 4.2 g/dL (ref 3.5–5.0)
ALT: 16 U/L (ref 0–55)
AST: 17 U/L (ref 5–34)
Alkaline Phosphatase: 77 U/L (ref 40–150)
Anion gap: 8 (ref 3–11)
BUN: 17 mg/dL (ref 7–26)
CO2: 25 mmol/L (ref 22–29)
CREATININE: 1.07 mg/dL (ref 0.70–1.30)
Calcium: 9.9 mg/dL (ref 8.4–10.4)
Chloride: 104 mmol/L (ref 98–109)
GFR calc Af Amer: >60 mL/min (ref >60)
GFR calc non Af Amer: >60 mL/min (ref >60)
GLUCOSE: 114 mg/dL (ref 70–140)
Potassium: 5.1 mmol/L (ref 3.5–5.1)
SODIUM: 137 mmol/L (ref 136–145)
Total Bilirubin: 0.9 mg/dL (ref 0.2–1.2)
Total Protein: 7.2 g/dL (ref 6.4–8.3)

## 2017-08-30 NOTE — Patient Instructions (Signed)
Therapeutic Phlebotomy Therapeutic phlebotomy is the controlled removal of blood from a person's body for the purpose of treating a medical condition. The procedure is similar to donating blood. Usually, about a pint (470 mL, or 0.47L) of blood is removed. The average adult has 9-12 pints (4.3-5.7 L) of blood. Therapeutic phlebotomy may be used to treat the following medical conditions:  Hemochromatosis. This is a condition in which the blood contains too much iron.  Polycythemia vera. This is a condition in which the blood contains too many red blood cells.  Porphyria cutanea tarda. This is a disease in which an important part of hemoglobin is not made properly. It results in the buildup of abnormal amounts of porphyrins in the body.  Sickle cell disease. This is a condition in which the red blood cells form an abnormal crescent shape rather than a round shape.  Tell a health care provider about:  Any allergies you have.  All medicines you are taking, including vitamins, herbs, eye drops, creams, and over-the-counter medicines.  Any problems you or family members have had with anesthetic medicines.  Any blood disorders you have.  Any surgeries you have had.  Any medical conditions you have. What are the risks? Generally, this is a safe procedure. However, problems may occur, including:  Nausea or light-headedness.  Low blood pressure.  Soreness, bleeding, swelling, or bruising at the needle insertion site.  Infection.  What happens before the procedure?  Follow instructions from your health care provider about eating or drinking restrictions.  Ask your health care provider about changing or stopping your regular medicines. This is especially important if you are taking diabetes medicines or blood thinners.  Wear clothing with sleeves that can be raised above the elbow.  Plan to have someone take you home after the procedure.  You may have a blood sample taken. What  happens during the procedure?  A needle will be inserted into one of your veins.  Tubing and a collection bag will be attached to that needle.  Blood will flow through the needle and tubing into the collection bag.  You may be asked to open and close your hand slowly and continually during the entire collection.  After the specified amount of blood has been removed from your body, the collection bag and tubing will be clamped.  The needle will be removed from your vein.  Pressure will be held on the site of the needle insertion to stop the bleeding.  A bandage (dressing) will be placed over the needle insertion site. The procedure may vary among health care providers and hospitals. What happens after the procedure?  Your recovery will be assessed and monitored.  You can return to your normal activities as directed by your health care provider. This information is not intended to replace advice given to you by your health care provider. Make sure you discuss any questions you have with your health care provider. Document Released: 12/04/2010 Document Revised: 03/03/2016 Document Reviewed: 06/28/2014 Elsevier Interactive Patient Education  2018 Elsevier Inc.  

## 2017-09-27 ENCOUNTER — Inpatient Hospital Stay: Payer: BLUE CROSS/BLUE SHIELD | Attending: Oncology

## 2017-09-27 ENCOUNTER — Inpatient Hospital Stay: Payer: BLUE CROSS/BLUE SHIELD

## 2017-09-27 ENCOUNTER — Telehealth: Payer: Self-pay

## 2017-09-27 ENCOUNTER — Inpatient Hospital Stay (HOSPITAL_BASED_OUTPATIENT_CLINIC_OR_DEPARTMENT_OTHER): Payer: BLUE CROSS/BLUE SHIELD | Admitting: Oncology

## 2017-09-27 VITALS — BP 126/81 | HR 76 | Temp 98.2°F | Resp 17 | Ht 70.0 in | Wt 178.2 lb

## 2017-09-27 VITALS — BP 118/76 | HR 72 | Temp 98.6°F | Resp 17

## 2017-09-27 DIAGNOSIS — D45 Polycythemia vera: Secondary | ICD-10-CM | POA: Insufficient documentation

## 2017-09-27 DIAGNOSIS — Z79899 Other long term (current) drug therapy: Secondary | ICD-10-CM | POA: Diagnosis not present

## 2017-09-27 DIAGNOSIS — Z7982 Long term (current) use of aspirin: Secondary | ICD-10-CM | POA: Insufficient documentation

## 2017-09-27 DIAGNOSIS — D72829 Elevated white blood cell count, unspecified: Secondary | ICD-10-CM | POA: Insufficient documentation

## 2017-09-27 LAB — COMPREHENSIVE METABOLIC PANEL
ALK PHOS: 68 U/L (ref 40–150)
ALT: 14 U/L (ref 0–55)
ANION GAP: 7 (ref 3–11)
AST: 15 U/L (ref 5–34)
Albumin: 3.9 g/dL (ref 3.5–5.0)
BILIRUBIN TOTAL: 0.7 mg/dL (ref 0.2–1.2)
BUN: 18 mg/dL (ref 7–26)
CALCIUM: 9.5 mg/dL (ref 8.4–10.4)
CO2: 27 mmol/L (ref 22–29)
Chloride: 105 mmol/L (ref 98–109)
Creatinine, Ser: 1.07 mg/dL (ref 0.70–1.30)
Glucose, Bld: 104 mg/dL (ref 70–140)
POTASSIUM: 4.6 mmol/L (ref 3.5–5.1)
Sodium: 139 mmol/L (ref 136–145)
Total Protein: 6.8 g/dL (ref 6.4–8.3)

## 2017-09-27 LAB — CBC WITH DIFFERENTIAL/PLATELET
BASOS ABS: 0.1 10*3/uL (ref 0.0–0.1)
BASOS PCT: 1 %
EOS PCT: 2 %
Eosinophils Absolute: 0.1 10*3/uL (ref 0.0–0.5)
HEMATOCRIT: 45.7 % (ref 38.4–49.9)
Hemoglobin: 14.4 g/dL (ref 13.0–17.1)
LYMPHS PCT: 9 %
Lymphs Abs: 0.7 10*3/uL — ABNORMAL LOW (ref 0.9–3.3)
MCH: 27.6 pg (ref 27.2–33.4)
MCHC: 31.4 g/dL — ABNORMAL LOW (ref 32.0–36.0)
MCV: 88 fL (ref 79.3–98.0)
Monocytes Absolute: 0.6 10*3/uL (ref 0.1–0.9)
Monocytes Relative: 7 %
NEUTROS ABS: 7.1 10*3/uL — AB (ref 1.5–6.5)
Neutrophils Relative %: 81 %
PLATELETS: 335 10*3/uL (ref 140–400)
RBC: 5.2 MIL/uL (ref 4.20–5.82)
RDW: 17.2 % — ABNORMAL HIGH (ref 11.0–14.6)
WBC: 8.7 10*3/uL (ref 4.0–10.3)

## 2017-09-27 NOTE — Patient Instructions (Signed)

## 2017-09-27 NOTE — Progress Notes (Signed)
Joshua Ryan. presents today for phlebotomy per MD orders. RAC accessed with 16G needle, and phlebotomy procedure started at 1413, and ended at 1419 501 grams removed. Patient tolerated procedure well and IV needle removed intact. Patient declined to stay for 30 minutes after procedure, but VS taken and stable. Driven home by wife without complaints.

## 2017-09-27 NOTE — Progress Notes (Signed)
Hematology and Oncology Follow Up Visit  Joshua Ryan [id number] 21-Mar-1946 72 y.o. 09/27/2017 1:40 PM Joshua Ryan, MDRoss, Joshua Luo, MD   Principle Diagnosis: 72 year old man with JAK2 positive polycythemia vera diagnosed in May 2018.  He presented with erythrocytosis and a hematocrit over 15 as well as leukocytosis.  Current therapy:  Phlebotomy on a monthly basis to keep his hemoglobin below 45. Hydroxyurea 500 mg daily started on 03/01/2017.  Interim History: Joshua Ryan is here for a follow-up visit.  He reports no major changes in his health and continues to tolerate phlebotomy and hydroxyurea without complications.  He denies any mouth sores or joint pain.  He denies any excessive fatigue or tiredness.  His performance status and activity level remained unchanged.  He denies any thrombosis or bleeding episodes.  He underwent a prostate biopsy and no evidence of malignancy detected at this time done by Dr. Junious Ryan.  He remains on tapering doses of prednisone currently at 3 mg daily.  He denies any complications related to that and has helped his arthritic pain.  He does not report any headaches, blurred vision, syncope or seizures. He does not report any fevers, chills, sweats or weight loss. He does not report any chest pain, palpitation, orthopnea or leg edema. He does not report any cough, wheezing or hemoptysis. He does not report any dysphagia exertion. He does not report any nausea, vomiting or abdominal pain.  He does not report any hematuria or dysuria.  He does not report any skin rashes or lesions.  He does not apparent lymphadenopathy or petechia.  Remaining review of systems is negative.  Medications: I have reviewed the patient's current medications.  Current Outpatient Medications  Medication Sig Dispense Refill  . aspirin 81 MG tablet Take 81 mg by mouth daily.      . Calcium-Magnesium-Vitamin D (CALCIUM 500 PO) Take 1 tablet by mouth 2 (two) times  daily.    . cetirizine (ZYRTEC) 10 MG tablet Take 10 mg by mouth daily.    . cholecalciferol (VITAMIN D) 1000 UNITS tablet Take 2,000 Units by mouth daily.     . famotidine (PEPCID) 20 MG tablet Take 20 mg by mouth 2 (two) times daily.    . hydroxyurea (HYDREA) 500 MG capsule Take 1 capsule (500 mg total) by mouth daily. May take with food to minimize GI side effects. 90 capsule 3  . irbesartan (AVAPRO) 300 MG tablet Take 300 mg by mouth daily.  3  . KRILL OIL PO Take 500 mg by mouth daily.     . Saw Palmetto, Serenoa repens, (SAW PALMETTO PO) Take 450 mg by mouth daily.      No current facility-administered medications for this visit.      Allergies: No Known Allergies  Past Medical History, Surgical history, Social history, and Family History updated without change.  Physical Exam: Blood pressure 126/81, pulse 76, temperature 98.2 F (36.8 C), temperature source Oral, resp. rate 17, height 5\' 10"  (1.778 m), weight 178 lb 3.2 oz (80.8 kg), SpO2 98 %. ECOG: 0 General appearance: Alert, awake gentleman without distress. Head: Normocephalic, without obvious abnormality.  Oropharynx: Without thrush or ulcers. Eyes: No scleral icterus. Lymph nodes: Cervical, supraclavicular, and axillary nodes normal. Heart: Regular rate and rhythm without any murmurs rubs or gallops. Lung: Clear to auscultation without any rhonchi, wheezes or dullness to percussion. Abdomin: Soft, nontender without any rebound or guarding. Musculoskeletal: No joint deformity or effusion. Skin: No rashes or lesions.   Lab  Results: Lab Results  Component Value Date   WBC 8.7 09/27/2017   HGB 14.4 09/27/2017   HCT 45.7 09/27/2017   MCV 88.0 09/27/2017   PLT 335 09/27/2017     Chemistry      Component Value Date/Time   NA 137 08/30/2017 1326   NA 139 06/28/2017 1451   K 5.1 08/30/2017 1326   K 5.5 No visable hemolysis (H) 06/28/2017 1451   CL 104 08/30/2017 1326   CO2 25 08/30/2017 1326   CO2 24 06/28/2017  1451   BUN 17 08/30/2017 1326   BUN 24.0 06/28/2017 1451   CREATININE 1.07 08/30/2017 1326   CREATININE 1.2 06/28/2017 1451      Component Value Date/Time   CALCIUM 9.9 08/30/2017 1326   CALCIUM 9.4 06/28/2017 1451   ALKPHOS 77 08/30/2017 1326   ALKPHOS 74 06/28/2017 1451   AST 17 08/30/2017 1326   AST 19 06/28/2017 1451   ALT 16 08/30/2017 1326   ALT 19 06/28/2017 1451   BILITOT 0.9 08/30/2017 1326   BILITOT 1.01 06/28/2017 1451        Impression and Plan:  1. Polycythemia vera with a positive JAK2 mutation. He is currently receiving phlebotomy to keep his hematocrit less than 45 and has tolerated this without complications.  His hematocrit today is above 45 and he is agreeable to proceed with phlebotomy.  Risks and benefits of continuing this approach was reviewed today and he is agreeable to continue.  Long-term complications associated with this therapy includes iron deficiency and excessive fatigue but has not experienced any at this time.  Decreasing the frequency of phlebotomy may be needed in the future after he achieves his goal.  2. Leukocytosis and thrombocytosis: Related to myeloproliferative disorder.  He is currently on hydroxyurea 500 mg and his CBC was reviewed today and showed normal range for his white cell count and platelets.  The plan is to continue with the same dose and schedule.  His risk of thrombosis is reduced by decreasing his white cell count.  3. Thrombosis prophylaxis: He is currently on aspirin.  No recent thrombosis or bleeding.  Controlling his white cell count and platelets will be essential to prevent any future thrombosis.  4. Follow-up: Monthly for phlebotomy and MD follow-up in 6 months.  15  minutes was spent with the patient face-to-face today.  More than 50% of time was dedicated to patient counseling, education and coordination of the patient's multifaceted care.     Zola Button, MD 3/15/20191:40 PM

## 2017-09-27 NOTE — Telephone Encounter (Signed)
Printed avs and calender of upcoming appointment. Per 3/15 los 

## 2017-10-25 ENCOUNTER — Inpatient Hospital Stay: Payer: BLUE CROSS/BLUE SHIELD

## 2017-10-25 ENCOUNTER — Inpatient Hospital Stay: Payer: BLUE CROSS/BLUE SHIELD | Attending: Oncology

## 2017-10-25 VITALS — BP 115/73 | HR 76 | Temp 98.0°F | Resp 17

## 2017-10-25 DIAGNOSIS — D45 Polycythemia vera: Secondary | ICD-10-CM | POA: Insufficient documentation

## 2017-10-25 LAB — CBC WITH DIFFERENTIAL/PLATELET
BASOS ABS: 0.1 10*3/uL (ref 0.0–0.1)
BASOS PCT: 1 %
EOS ABS: 0.2 10*3/uL (ref 0.0–0.5)
Eosinophils Relative: 2 %
HEMATOCRIT: 45.8 % (ref 38.4–49.9)
HEMOGLOBIN: 14.1 g/dL (ref 13.0–17.1)
Lymphocytes Relative: 11 %
Lymphs Abs: 1.1 10*3/uL (ref 0.9–3.3)
MCH: 27.1 pg — ABNORMAL LOW (ref 27.2–33.4)
MCHC: 30.8 g/dL — AB (ref 32.0–36.0)
MCV: 87.9 fL (ref 79.3–98.0)
Monocytes Absolute: 0.9 10*3/uL (ref 0.1–0.9)
Monocytes Relative: 9 %
NEUTROS PCT: 77 %
Neutro Abs: 7.4 10*3/uL — ABNORMAL HIGH (ref 1.5–6.5)
Platelets: 434 10*3/uL — ABNORMAL HIGH (ref 140–400)
RBC: 5.21 MIL/uL (ref 4.20–5.82)
RDW: 15.4 % — ABNORMAL HIGH (ref 11.0–14.6)
WBC: 9.6 10*3/uL (ref 4.0–10.3)

## 2017-10-25 LAB — COMPREHENSIVE METABOLIC PANEL
ALK PHOS: 71 U/L (ref 40–150)
ALT: 15 U/L (ref 0–55)
AST: 18 U/L (ref 5–34)
Albumin: 4 g/dL (ref 3.5–5.0)
Anion gap: 7 (ref 3–11)
BUN: 16 mg/dL (ref 7–26)
CALCIUM: 9.5 mg/dL (ref 8.4–10.4)
CO2: 28 mmol/L (ref 22–29)
CREATININE: 1.19 mg/dL (ref 0.70–1.30)
Chloride: 106 mmol/L (ref 98–109)
GFR, EST NON AFRICAN AMERICAN: 59 mL/min — AB (ref >60)
Glucose, Bld: 83 mg/dL (ref 70–140)
Potassium: 4.9 mmol/L (ref 3.5–5.1)
SODIUM: 141 mmol/L (ref 136–145)
TOTAL PROTEIN: 7.1 g/dL (ref 6.4–8.3)
Total Bilirubin: 0.9 mg/dL (ref 0.2–1.2)

## 2017-10-25 NOTE — Progress Notes (Signed)
Joshua Ryan. presents today for phlebotomy per MD orders. 16 G phlebotomy kit used in L AC. Phlebotomy procedure started at 1335 and ended at 1341 with 534 cc removed. Patient tolerated procedure well. VSS at discharge. IV needle removed intact.

## 2017-10-25 NOTE — Patient Instructions (Signed)

## 2017-11-22 ENCOUNTER — Inpatient Hospital Stay: Payer: BLUE CROSS/BLUE SHIELD | Attending: Oncology

## 2017-11-22 ENCOUNTER — Inpatient Hospital Stay: Payer: BLUE CROSS/BLUE SHIELD

## 2017-11-22 DIAGNOSIS — D45 Polycythemia vera: Secondary | ICD-10-CM

## 2017-11-22 LAB — COMPREHENSIVE METABOLIC PANEL WITH GFR
ALT: 17 U/L (ref 0–55)
AST: 17 U/L (ref 5–34)
Albumin: 4.3 g/dL (ref 3.5–5.0)
Alkaline Phosphatase: 74 U/L (ref 40–150)
Anion gap: 7 (ref 3–11)
BUN: 17 mg/dL (ref 7–26)
CO2: 24 mmol/L (ref 22–29)
Calcium: 9.1 mg/dL (ref 8.4–10.4)
Chloride: 108 mmol/L (ref 98–109)
Creatinine, Ser: 1.16 mg/dL (ref 0.70–1.30)
GFR calc Af Amer: >60 mL/min
GFR calc non Af Amer: >60 mL/min
Glucose, Bld: 104 mg/dL (ref 70–140)
Potassium: 5.3 mmol/L — ABNORMAL HIGH (ref 3.5–5.1)
Sodium: 139 mmol/L (ref 136–145)
Total Bilirubin: 0.6 mg/dL (ref 0.2–1.2)
Total Protein: 7.1 g/dL (ref 6.4–8.3)

## 2017-11-22 LAB — CBC WITH DIFFERENTIAL/PLATELET
BASOS ABS: 0.1 10*3/uL (ref 0.0–0.1)
Basophils Relative: 1 %
EOS PCT: 1 %
Eosinophils Absolute: 0.1 10*3/uL (ref 0.0–0.5)
HEMATOCRIT: 41.8 % (ref 38.4–49.9)
Hemoglobin: 13.1 g/dL (ref 13.0–17.1)
LYMPHS PCT: 8 %
Lymphs Abs: 0.9 10*3/uL (ref 0.9–3.3)
MCH: 26.2 pg — AB (ref 27.2–33.4)
MCHC: 31.3 g/dL — ABNORMAL LOW (ref 32.0–36.0)
MCV: 83.8 fL (ref 79.3–98.0)
MONO ABS: 0.6 10*3/uL (ref 0.1–0.9)
Monocytes Relative: 6 %
NEUTROS ABS: 9.2 10*3/uL — AB (ref 1.5–6.5)
Neutrophils Relative %: 84 %
PLATELETS: 559 10*3/uL — AB (ref 140–400)
RBC: 4.99 MIL/uL (ref 4.20–5.82)
RDW: 17.1 % — AB (ref 11.0–14.6)
WBC: 10.9 10*3/uL — ABNORMAL HIGH (ref 4.0–10.3)

## 2017-11-22 NOTE — Patient Instructions (Signed)
No phlebotomy needed today d/t HCT within acceptable limits and patient without symptoms.   Pt will return for appointments as scheduled.

## 2017-11-22 NOTE — Progress Notes (Signed)
Nlo phlebotomy today d/t HCT below 45 and pt without any symptoms.  Pt verbalizes understanding of upcoming appt schedule and knows to call the clinic with any issues.

## 2017-12-26 ENCOUNTER — Other Ambulatory Visit: Payer: Self-pay | Admitting: *Deleted

## 2017-12-26 DIAGNOSIS — D45 Polycythemia vera: Secondary | ICD-10-CM

## 2017-12-27 ENCOUNTER — Inpatient Hospital Stay: Payer: BLUE CROSS/BLUE SHIELD

## 2017-12-27 ENCOUNTER — Other Ambulatory Visit: Payer: Self-pay | Admitting: Oncology

## 2017-12-27 ENCOUNTER — Inpatient Hospital Stay: Payer: BLUE CROSS/BLUE SHIELD | Attending: Oncology

## 2017-12-27 VITALS — BP 110/63 | HR 81 | Temp 97.9°F | Resp 16

## 2017-12-27 DIAGNOSIS — D45 Polycythemia vera: Secondary | ICD-10-CM | POA: Diagnosis not present

## 2017-12-27 LAB — CBC WITH DIFFERENTIAL (CANCER CENTER ONLY)
Basophils Absolute: 0.1 10*3/uL (ref 0.0–0.1)
Basophils Relative: 1 %
EOS PCT: 1 %
Eosinophils Absolute: 0.1 10*3/uL (ref 0.0–0.5)
HEMATOCRIT: 47 % (ref 38.4–49.9)
HEMOGLOBIN: 14.3 g/dL (ref 13.0–17.1)
LYMPHS ABS: 1 10*3/uL (ref 0.9–3.3)
LYMPHS PCT: 8 %
MCH: 26.1 pg — AB (ref 27.2–33.4)
MCHC: 30.4 g/dL — ABNORMAL LOW (ref 32.0–36.0)
MCV: 85.9 fL (ref 79.3–98.0)
Monocytes Absolute: 0.8 10*3/uL (ref 0.1–0.9)
Monocytes Relative: 7 %
Neutro Abs: 10.3 10*3/uL — ABNORMAL HIGH (ref 1.5–6.5)
Neutrophils Relative %: 83 %
Platelet Count: 548 10*3/uL — ABNORMAL HIGH (ref 140–400)
RBC: 5.47 MIL/uL (ref 4.20–5.82)
RDW: 17.8 % — ABNORMAL HIGH (ref 11.0–14.6)
WBC: 12.3 10*3/uL — AB (ref 4.0–10.3)

## 2017-12-27 LAB — COMPREHENSIVE METABOLIC PANEL
ALBUMIN: 4.2 g/dL (ref 3.5–5.0)
ALT: 13 U/L (ref 0–55)
ANION GAP: 7 (ref 3–11)
AST: 14 U/L (ref 5–34)
Alkaline Phosphatase: 67 U/L (ref 40–150)
BUN: 23 mg/dL (ref 7–26)
CHLORIDE: 106 mmol/L (ref 98–109)
CO2: 24 mmol/L (ref 22–29)
Calcium: 9.2 mg/dL (ref 8.4–10.4)
Creatinine, Ser: 1.25 mg/dL (ref 0.70–1.30)
GFR calc Af Amer: >60 mL/min (ref >60)
GFR, EST NON AFRICAN AMERICAN: 55 mL/min — AB (ref >60)
Glucose, Bld: 132 mg/dL (ref 70–140)
POTASSIUM: 5.2 mmol/L — AB (ref 3.5–5.1)
Sodium: 137 mmol/L (ref 136–145)
Total Bilirubin: 0.7 mg/dL (ref 0.2–1.2)
Total Protein: 6.6 g/dL (ref 6.4–8.3)

## 2017-12-27 NOTE — Patient Instructions (Signed)

## 2018-01-24 ENCOUNTER — Inpatient Hospital Stay: Payer: BLUE CROSS/BLUE SHIELD | Attending: Oncology

## 2018-01-24 ENCOUNTER — Inpatient Hospital Stay: Payer: BLUE CROSS/BLUE SHIELD

## 2018-01-24 DIAGNOSIS — D45 Polycythemia vera: Secondary | ICD-10-CM | POA: Diagnosis not present

## 2018-01-24 LAB — CBC WITH DIFFERENTIAL (CANCER CENTER ONLY)
Basophils Absolute: 0.1 10*3/uL (ref 0.0–0.1)
Basophils Relative: 1 %
EOS ABS: 0.1 10*3/uL (ref 0.0–0.5)
EOS PCT: 1 %
HCT: 43.1 % (ref 38.4–49.9)
HEMOGLOBIN: 13.2 g/dL (ref 13.0–17.1)
LYMPHS ABS: 0.7 10*3/uL — AB (ref 0.9–3.3)
LYMPHS PCT: 8 %
MCH: 26.4 pg — AB (ref 27.2–33.4)
MCHC: 30.6 g/dL — ABNORMAL LOW (ref 32.0–36.0)
MCV: 86.2 fL (ref 79.3–98.0)
MONOS PCT: 4 %
Monocytes Absolute: 0.4 10*3/uL (ref 0.1–0.9)
Neutro Abs: 8.2 10*3/uL — ABNORMAL HIGH (ref 1.5–6.5)
Neutrophils Relative %: 86 %
PLATELETS: 486 10*3/uL — AB (ref 140–400)
RBC: 5 MIL/uL (ref 4.20–5.82)
RDW: 17.9 % — ABNORMAL HIGH (ref 11.0–14.6)
WBC Count: 9.4 10*3/uL (ref 4.0–10.3)

## 2018-01-24 NOTE — Progress Notes (Signed)
Per Dr. Alen Blew, patient does not need phlebotomy today. Made patient aware. Verbalized understanding.

## 2018-02-17 ENCOUNTER — Other Ambulatory Visit: Payer: Self-pay | Admitting: Oncology

## 2018-02-17 DIAGNOSIS — D45 Polycythemia vera: Secondary | ICD-10-CM

## 2018-02-28 ENCOUNTER — Inpatient Hospital Stay: Payer: Medicare HMO | Attending: Oncology

## 2018-02-28 ENCOUNTER — Inpatient Hospital Stay: Payer: Medicare HMO

## 2018-02-28 DIAGNOSIS — D45 Polycythemia vera: Secondary | ICD-10-CM

## 2018-02-28 LAB — CBC WITH DIFFERENTIAL (CANCER CENTER ONLY)
BASOS PCT: 1 %
Basophils Absolute: 0.1 10*3/uL (ref 0.0–0.1)
EOS ABS: 0.1 10*3/uL (ref 0.0–0.5)
EOS PCT: 1 %
HCT: 42.9 % (ref 38.4–49.9)
HEMOGLOBIN: 13.5 g/dL (ref 13.0–17.1)
Lymphocytes Relative: 9 %
Lymphs Abs: 0.8 10*3/uL — ABNORMAL LOW (ref 0.9–3.3)
MCH: 26.4 pg — AB (ref 27.2–33.4)
MCHC: 31.5 g/dL — AB (ref 32.0–36.0)
MCV: 83.7 fL (ref 79.3–98.0)
MONOS PCT: 5 %
Monocytes Absolute: 0.5 10*3/uL (ref 0.1–0.9)
NEUTROS PCT: 84 %
Neutro Abs: 7.6 10*3/uL — ABNORMAL HIGH (ref 1.5–6.5)
PLATELETS: 387 10*3/uL (ref 140–400)
RBC: 5.12 MIL/uL (ref 4.20–5.82)
RDW: 17.6 % — AB (ref 11.0–14.6)
WBC Count: 9 10*3/uL (ref 4.0–10.3)

## 2018-02-28 NOTE — Progress Notes (Signed)
HCT 42.9 today -phlebotomy cancelled per Dr Alen Blew. Pt notified.

## 2018-03-28 ENCOUNTER — Other Ambulatory Visit: Payer: Self-pay | Admitting: *Deleted

## 2018-03-28 ENCOUNTER — Inpatient Hospital Stay: Payer: Medicare HMO | Attending: Oncology | Admitting: Oncology

## 2018-03-28 ENCOUNTER — Inpatient Hospital Stay: Payer: Medicare HMO

## 2018-03-28 VITALS — BP 131/85 | HR 78 | Temp 98.1°F | Resp 18 | Wt 186.4 lb

## 2018-03-28 DIAGNOSIS — D6959 Other secondary thrombocytopenia: Secondary | ICD-10-CM | POA: Diagnosis not present

## 2018-03-28 DIAGNOSIS — D72828 Other elevated white blood cell count: Secondary | ICD-10-CM | POA: Diagnosis not present

## 2018-03-28 DIAGNOSIS — D45 Polycythemia vera: Secondary | ICD-10-CM

## 2018-03-28 DIAGNOSIS — Z79899 Other long term (current) drug therapy: Secondary | ICD-10-CM | POA: Diagnosis not present

## 2018-03-28 LAB — CBC WITH DIFFERENTIAL (CANCER CENTER ONLY)
Basophils Absolute: 0.1 10*3/uL (ref 0.0–0.1)
Basophils Relative: 1 %
EOS ABS: 0.1 10*3/uL (ref 0.0–0.5)
EOS PCT: 1 %
HCT: 46.2 % (ref 38.4–49.9)
Hemoglobin: 14.5 g/dL (ref 13.0–17.1)
LYMPHS ABS: 0.9 10*3/uL (ref 0.9–3.3)
Lymphocytes Relative: 8 %
MCH: 26.9 pg — AB (ref 27.2–33.4)
MCHC: 31.4 g/dL — AB (ref 32.0–36.0)
MCV: 85.6 fL (ref 79.3–98.0)
MONOS PCT: 6 %
Monocytes Absolute: 0.7 10*3/uL (ref 0.1–0.9)
Neutro Abs: 9.9 10*3/uL — ABNORMAL HIGH (ref 1.5–6.5)
Neutrophils Relative %: 84 %
PLATELETS: 417 10*3/uL — AB (ref 140–400)
RBC: 5.4 MIL/uL (ref 4.20–5.82)
RDW: 17.2 % — AB (ref 11.0–14.6)
WBC Count: 11.7 10*3/uL — ABNORMAL HIGH (ref 4.0–10.3)

## 2018-03-28 MED ORDER — HYDROXYUREA 500 MG PO CAPS: 500.0000 mg | ORAL_CAPSULE | Freq: Two times a day (BID) | ORAL | 0 refills | Status: DC

## 2018-03-28 NOTE — Progress Notes (Signed)
Hematology and Oncology Follow Up Visit  Joshua Ryan [id number] 04/11/1946 72 y.o. 03/28/2018 12:43 PM Joshua Ryan, MDRoss, Joshua Luo, MD   Principle Diagnosis: 72 year old man with JAK2 positive myeloproliferative disorder presented with erythrocytosis in May 2018.  He was found to have polycythemia vera at that time.   Current therapy:  Phlebotomy on a monthly basis to keep his hemoglobin below 45.  Hydroxyurea 500 mg daily started on 03/01/2017.  Interim History: [name] is here for a follow-up.  Since the last visit, he reports no major changes in his health.  He continues to tolerate hydroxyurea at the current dose without any issues or complaints.  He has not received phlebotomy in the last few months with his hematocrit less than 45.  He denies any pruritus or mild sores.  He denies any excessive fatigue or tiredness.  His appetite and performance status remains unchanged.  He does not report any constitutional symptoms or arthralgias.  He does not report any headaches, blurred vision, syncope or seizures.  He denies any alteration mental status or dizziness.  He does not report any fevers, chills, sweats or weight loss. He does not report any chest pain, palpitation, orthopnea or leg edema. He does not report any cough, wheezing or hemoptysis. He does not report any change in his bowel habits. He does not report any nausea, vomiting or abdominal pain.  He does not report any frequency urgency or dysuria.  He denies any hematochezia or melena.  He does not report any skin rashes or lesions.  He does not any bleeding or clotting tendencies.  He denies any changes in his mood.  Remaining review of systems is negative.  Medications: I have reviewed the patient's current medications.  Current Outpatient Medications  Medication Sig Dispense Refill  . aspirin 81 MG tablet Take 81 mg by mouth daily.      . Calcium-Magnesium-Vitamin D (CALCIUM 500 PO) Take 1 tablet by  mouth 2 (two) times daily.    . cetirizine (ZYRTEC) 10 MG tablet Take 10 mg by mouth daily.    . cholecalciferol (VITAMIN D) 1000 UNITS tablet Take 2,000 Units by mouth daily.     . famotidine (PEPCID) 20 MG tablet Take 20 mg by mouth 2 (two) times daily.    . hydroxyurea (HYDREA) 500 MG capsule TAKE 1 CAPSULE(500 MG) BY MOUTH DAILY. MAY TAKE WITH FOOD TO MINIMIZE GI SIDE EFFECTS 90 capsule 0  . irbesartan (AVAPRO) 300 MG tablet Take 300 mg by mouth daily.  3  . KRILL OIL PO Take 500 mg by mouth daily.     . predniSONE (DELTASONE) 1 MG tablet Take 3 mg by mouth daily.  2  . Saw Palmetto, Serenoa repens, (SAW PALMETTO PO) Take 450 mg by mouth daily.      No current facility-administered medications for this visit.      Allergies: No Known Allergies  Past Medical History, Surgical history, Social history, and Family History updated without change.  Physical Exam:   ECOG: 0  General appearance: Comfortable appearing without any discomfort Head: Normocephalic without any trauma Oropharynx: Mucous membranes are moist and pink without any thrush or ulcers. Eyes: Pupils are equal and round reactive to light. Lymph nodes: No cervical, supraclavicular, inguinal or axillary lymphadenopathy.   Heart:regular rate and rhythm.  S1 and S2 without leg edema. Lung: Clear without any rhonchi or wheezes.  No dullness to percussion. Abdomin: Soft, nontender, nondistended with good bowel sounds.  No hepatosplenomegaly. Musculoskeletal:  No joint deformity or effusion.  Full range of motion noted. Neurological: No deficits noted on motor, sensory and deep tendon reflex exam. Skin: No petechial rash or dryness.  Appeared moist.    Lab Results: Lab Results  Component Value Date   WBC 11.7 (H) 03/28/2018   HGB 14.5 03/28/2018   HCT 46.2 03/28/2018   MCV 85.6 03/28/2018   PLT 417 (H) 03/28/2018     Chemistry      Component Value Date/Time   NA 137 12/27/2017 1321   NA 139 06/28/2017 1451   K  5.2 (H) 12/27/2017 1321   K 5.5 No visable hemolysis (H) 06/28/2017 1451   CL 106 12/27/2017 1321   CO2 24 12/27/2017 1321   CO2 24 06/28/2017 1451   BUN 23 12/27/2017 1321   BUN 24.0 06/28/2017 1451   CREATININE 1.25 12/27/2017 1321   CREATININE 1.2 06/28/2017 1451      Component Value Date/Time   CALCIUM 9.2 12/27/2017 1321   CALCIUM 9.4 06/28/2017 1451   ALKPHOS 67 12/27/2017 1321   ALKPHOS 74 06/28/2017 1451   AST 14 12/27/2017 1321   AST 19 06/28/2017 1451   ALT 13 12/27/2017 1321   ALT 19 06/28/2017 1451   BILITOT 0.7 12/27/2017 1321   BILITOT 1.01 06/28/2017 1451        Impression and Plan:  1. JAK2 positive myeloproliferative disorder in the form of polycythemia vera diagnosed in May 2018.  He is currently receiving monthly phlebotomy to keep his hematocrit below 45 without any complications.  His hematocrit today is 46 and would recommend proceeding with phlebotomy.  Risks and benefits of continuing this approach long-term was reviewed today and he is agreeable to proceed.  I anticipate decrease in the frequency of phlebotomy moving forward to every 3 to 6 months depending on his counts.  2. Leukocytosis and thrombocytosis: Due to myeloproliferative disorder and has been under reasonable control.  He remains on hydroxyurea at 500 mg daily.  His CBC reviewed today and showed mild elevation in his white cell count and platelets.  I recommended increasing the dose to 500 mg twice a day for better control of his hemoglobin and attempt to decrease the frequency of her phlebotomy.  Complications associated with this medication were reviewed again.  Includes arthralgias, myalgias, pruritus and rarely bone marrow diseases.  3. Thrombosis prophylaxis: He remains on aspirin without any thrombosis or bleeding issues.  4. Follow-up: We will be monthly for phlebotomy in 6 months for a clinical visit.  15  minutes was spent with the patient face-to-face today.  More than 50% of  time was dedicated to reviewing the natural course of his disease, treatment options and coordinating his plan of care     Zola Button, MD 9/13/201912:43 PM

## 2018-03-28 NOTE — Patient Instructions (Signed)

## 2018-03-28 NOTE — Progress Notes (Signed)
Phlebotomy performed in Right antecubital with 16G angiocath easily.  Approx  558 gm of blood obtained and wasted.  Pt tolerared procedure without problems.  Refreshment given.

## 2018-03-31 DIAGNOSIS — R972 Elevated prostate specific antigen [PSA]: Secondary | ICD-10-CM | POA: Diagnosis not present

## 2018-03-31 DIAGNOSIS — N400 Enlarged prostate without lower urinary tract symptoms: Secondary | ICD-10-CM | POA: Diagnosis not present

## 2018-04-12 DIAGNOSIS — R69 Illness, unspecified: Secondary | ICD-10-CM | POA: Diagnosis not present

## 2018-04-25 ENCOUNTER — Inpatient Hospital Stay: Payer: Medicare HMO | Attending: Oncology

## 2018-04-25 ENCOUNTER — Inpatient Hospital Stay: Payer: Medicare HMO

## 2018-04-25 DIAGNOSIS — D72828 Other elevated white blood cell count: Secondary | ICD-10-CM | POA: Diagnosis not present

## 2018-04-25 DIAGNOSIS — D6959 Other secondary thrombocytopenia: Secondary | ICD-10-CM | POA: Insufficient documentation

## 2018-04-25 DIAGNOSIS — Z79899 Other long term (current) drug therapy: Secondary | ICD-10-CM | POA: Diagnosis not present

## 2018-04-25 DIAGNOSIS — D45 Polycythemia vera: Secondary | ICD-10-CM | POA: Insufficient documentation

## 2018-04-25 LAB — CBC WITH DIFFERENTIAL (CANCER CENTER ONLY)
Abs Immature Granulocytes: 0.05 10*3/uL (ref 0.00–0.07)
Basophils Absolute: 0.1 10*3/uL (ref 0.0–0.1)
Basophils Relative: 1 %
Eosinophils Absolute: 0.1 10*3/uL (ref 0.0–0.5)
Eosinophils Relative: 1 %
HCT: 43 % (ref 39.0–52.0)
Hemoglobin: 13.4 g/dL (ref 13.0–17.0)
Immature Granulocytes: 1 %
Lymphocytes Relative: 11 %
Lymphs Abs: 0.7 10*3/uL (ref 0.7–4.0)
MCH: 27.7 pg (ref 26.0–34.0)
MCHC: 31.2 g/dL (ref 30.0–36.0)
MCV: 89 fL (ref 80.0–100.0)
Monocytes Absolute: 0.3 10*3/uL (ref 0.1–1.0)
Monocytes Relative: 5 %
Neutro Abs: 5.4 10*3/uL (ref 1.7–7.7)
Neutrophils Relative %: 81 %
Platelet Count: 330 10*3/uL (ref 150–400)
RBC: 4.83 MIL/uL (ref 4.22–5.81)
RDW: 20.6 % — ABNORMAL HIGH (ref 11.5–15.5)
WBC Count: 6.6 10*3/uL (ref 4.0–10.5)
nRBC: 0 % (ref 0.0–0.2)

## 2018-04-25 NOTE — Progress Notes (Signed)
Pt's Hct 43 therefore phlebotomy not indicated. Pt provided with copy of lab results and updated on plan. Verbalized understanding

## 2018-05-12 DIAGNOSIS — D751 Secondary polycythemia: Secondary | ICD-10-CM | POA: Diagnosis not present

## 2018-05-12 DIAGNOSIS — E663 Overweight: Secondary | ICD-10-CM | POA: Diagnosis not present

## 2018-05-12 DIAGNOSIS — M2550 Pain in unspecified joint: Secondary | ICD-10-CM | POA: Diagnosis not present

## 2018-05-12 DIAGNOSIS — M353 Polymyalgia rheumatica: Secondary | ICD-10-CM | POA: Diagnosis not present

## 2018-05-12 DIAGNOSIS — Z7952 Long term (current) use of systemic steroids: Secondary | ICD-10-CM | POA: Diagnosis not present

## 2018-05-12 DIAGNOSIS — Z6826 Body mass index (BMI) 26.0-26.9, adult: Secondary | ICD-10-CM | POA: Diagnosis not present

## 2018-05-23 ENCOUNTER — Inpatient Hospital Stay: Payer: Medicare HMO | Attending: Oncology

## 2018-05-23 ENCOUNTER — Inpatient Hospital Stay: Payer: Medicare HMO

## 2018-05-23 DIAGNOSIS — D45 Polycythemia vera: Secondary | ICD-10-CM | POA: Diagnosis present

## 2018-05-23 LAB — CBC WITH DIFFERENTIAL (CANCER CENTER ONLY)
ABS IMMATURE GRANULOCYTES: 0.05 10*3/uL (ref 0.00–0.07)
BASOS PCT: 1 %
Basophils Absolute: 0.1 10*3/uL (ref 0.0–0.1)
Eosinophils Absolute: 0.1 10*3/uL (ref 0.0–0.5)
Eosinophils Relative: 1 %
HCT: 42.1 % (ref 39.0–52.0)
HEMOGLOBIN: 13.5 g/dL (ref 13.0–17.0)
IMMATURE GRANULOCYTES: 1 %
LYMPHS PCT: 11 %
Lymphs Abs: 0.7 10*3/uL (ref 0.7–4.0)
MCH: 30.7 pg (ref 26.0–34.0)
MCHC: 32.1 g/dL (ref 30.0–36.0)
MCV: 95.7 fL (ref 80.0–100.0)
MONOS PCT: 8 %
Monocytes Absolute: 0.5 10*3/uL (ref 0.1–1.0)
NEUTROS ABS: 4.9 10*3/uL (ref 1.7–7.7)
NEUTROS PCT: 78 %
PLATELETS: 385 10*3/uL (ref 150–400)
RBC: 4.4 MIL/uL (ref 4.22–5.81)
RDW: 24.4 % — ABNORMAL HIGH (ref 11.5–15.5)
WBC Count: 6.3 10*3/uL (ref 4.0–10.5)
nRBC: 0 % (ref 0.0–0.2)

## 2018-05-23 NOTE — Progress Notes (Signed)
Phlebotomy complete. R AC accessed. 650cc removed easily. Pt. Tolerated well.

## 2018-05-23 NOTE — Patient Instructions (Signed)

## 2018-06-20 ENCOUNTER — Other Ambulatory Visit: Payer: Self-pay | Admitting: Oncology

## 2018-06-23 DIAGNOSIS — L570 Actinic keratosis: Secondary | ICD-10-CM | POA: Diagnosis not present

## 2018-06-27 ENCOUNTER — Inpatient Hospital Stay: Payer: Medicare HMO | Attending: Oncology

## 2018-06-27 ENCOUNTER — Inpatient Hospital Stay: Payer: Medicare HMO

## 2018-06-27 DIAGNOSIS — D45 Polycythemia vera: Secondary | ICD-10-CM | POA: Diagnosis not present

## 2018-06-27 LAB — CBC WITH DIFFERENTIAL (CANCER CENTER ONLY)
Abs Immature Granulocytes: 0.03 10*3/uL (ref 0.00–0.07)
BASOS ABS: 0 10*3/uL (ref 0.0–0.1)
Basophils Relative: 1 %
EOS PCT: 1 %
Eosinophils Absolute: 0.1 10*3/uL (ref 0.0–0.5)
HCT: 35.9 % — ABNORMAL LOW (ref 39.0–52.0)
HEMOGLOBIN: 12.1 g/dL — AB (ref 13.0–17.0)
IMMATURE GRANULOCYTES: 1 %
LYMPHS PCT: 15 %
Lymphs Abs: 0.8 10*3/uL (ref 0.7–4.0)
MCH: 34.6 pg — ABNORMAL HIGH (ref 26.0–34.0)
MCHC: 33.7 g/dL (ref 30.0–36.0)
MCV: 102.6 fL — AB (ref 80.0–100.0)
MONOS PCT: 11 %
Monocytes Absolute: 0.6 10*3/uL (ref 0.1–1.0)
NEUTROS PCT: 71 %
NRBC: 0 % (ref 0.0–0.2)
Neutro Abs: 3.8 10*3/uL (ref 1.7–7.7)
Platelet Count: 247 10*3/uL (ref 150–400)
RBC: 3.5 MIL/uL — ABNORMAL LOW (ref 4.22–5.81)
RDW: 22.3 % — ABNORMAL HIGH (ref 11.5–15.5)
WBC Count: 5.3 10*3/uL (ref 4.0–10.5)

## 2018-06-27 NOTE — Progress Notes (Signed)
Phlebotomy not indicated today per pt's CBC result. HCT is 35.9 today. Pt verbalized understanding and agrees with plan of care.

## 2018-07-25 ENCOUNTER — Inpatient Hospital Stay: Payer: Medicare HMO | Attending: Oncology

## 2018-07-25 ENCOUNTER — Inpatient Hospital Stay: Payer: Medicare HMO

## 2018-07-25 DIAGNOSIS — D72828 Other elevated white blood cell count: Secondary | ICD-10-CM | POA: Insufficient documentation

## 2018-07-25 DIAGNOSIS — Z79899 Other long term (current) drug therapy: Secondary | ICD-10-CM | POA: Insufficient documentation

## 2018-07-25 DIAGNOSIS — D6959 Other secondary thrombocytopenia: Secondary | ICD-10-CM | POA: Diagnosis not present

## 2018-07-25 DIAGNOSIS — D45 Polycythemia vera: Secondary | ICD-10-CM | POA: Diagnosis not present

## 2018-07-25 LAB — CBC WITH DIFFERENTIAL (CANCER CENTER ONLY)
ABS IMMATURE GRANULOCYTES: 0.04 10*3/uL (ref 0.00–0.07)
BASOS ABS: 0.1 10*3/uL (ref 0.0–0.1)
BASOS PCT: 1 %
Eosinophils Absolute: 0 10*3/uL (ref 0.0–0.5)
Eosinophils Relative: 1 %
HCT: 37.9 % — ABNORMAL LOW (ref 39.0–52.0)
HEMOGLOBIN: 13.1 g/dL (ref 13.0–17.0)
IMMATURE GRANULOCYTES: 1 %
LYMPHS PCT: 17 %
Lymphs Abs: 0.9 10*3/uL (ref 0.7–4.0)
MCH: 38.1 pg — AB (ref 26.0–34.0)
MCHC: 34.6 g/dL (ref 30.0–36.0)
MCV: 110.2 fL — ABNORMAL HIGH (ref 80.0–100.0)
MONO ABS: 0.5 10*3/uL (ref 0.1–1.0)
Monocytes Relative: 9 %
NEUTROS ABS: 3.8 10*3/uL (ref 1.7–7.7)
NEUTROS PCT: 71 %
NRBC: 0 % (ref 0.0–0.2)
PLATELETS: 228 10*3/uL (ref 150–400)
RBC: 3.44 MIL/uL — AB (ref 4.22–5.81)
RDW: 17.2 % — ABNORMAL HIGH (ref 11.5–15.5)
WBC: 5.3 10*3/uL (ref 4.0–10.5)

## 2018-07-25 NOTE — Progress Notes (Signed)
Labs reviewed. Hct did not meet phlebotomy parameters mentioned in Dr. Hazeline Junker most recent office visit note. Patient denies any problems. No phlebotomy performed today.

## 2018-08-11 DIAGNOSIS — D751 Secondary polycythemia: Secondary | ICD-10-CM | POA: Diagnosis not present

## 2018-08-11 DIAGNOSIS — E663 Overweight: Secondary | ICD-10-CM | POA: Diagnosis not present

## 2018-08-11 DIAGNOSIS — Z7952 Long term (current) use of systemic steroids: Secondary | ICD-10-CM | POA: Diagnosis not present

## 2018-08-11 DIAGNOSIS — M353 Polymyalgia rheumatica: Secondary | ICD-10-CM | POA: Diagnosis not present

## 2018-08-11 DIAGNOSIS — M2550 Pain in unspecified joint: Secondary | ICD-10-CM | POA: Diagnosis not present

## 2018-08-11 DIAGNOSIS — Z6826 Body mass index (BMI) 26.0-26.9, adult: Secondary | ICD-10-CM | POA: Diagnosis not present

## 2018-08-19 DIAGNOSIS — R69 Illness, unspecified: Secondary | ICD-10-CM | POA: Diagnosis not present

## 2018-08-22 ENCOUNTER — Inpatient Hospital Stay: Payer: Medicare HMO

## 2018-08-22 ENCOUNTER — Inpatient Hospital Stay: Payer: Medicare HMO | Attending: Oncology

## 2018-08-22 DIAGNOSIS — D45 Polycythemia vera: Secondary | ICD-10-CM | POA: Insufficient documentation

## 2018-08-22 LAB — CBC WITH DIFFERENTIAL (CANCER CENTER ONLY)
ABS IMMATURE GRANULOCYTES: 0.04 10*3/uL (ref 0.00–0.07)
BASOS PCT: 1 %
Basophils Absolute: 0 10*3/uL (ref 0.0–0.1)
Eosinophils Absolute: 0 10*3/uL (ref 0.0–0.5)
Eosinophils Relative: 1 %
HCT: 33.5 % — ABNORMAL LOW (ref 39.0–52.0)
HEMOGLOBIN: 11.8 g/dL — AB (ref 13.0–17.0)
IMMATURE GRANULOCYTES: 1 %
LYMPHS PCT: 20 %
Lymphs Abs: 0.8 10*3/uL (ref 0.7–4.0)
MCH: 40.7 pg — ABNORMAL HIGH (ref 26.0–34.0)
MCHC: 35.2 g/dL (ref 30.0–36.0)
MCV: 115.5 fL — ABNORMAL HIGH (ref 80.0–100.0)
MONO ABS: 0.4 10*3/uL (ref 0.1–1.0)
MONOS PCT: 10 %
NEUTROS PCT: 67 %
Neutro Abs: 2.7 10*3/uL (ref 1.7–7.7)
PLATELETS: 186 10*3/uL (ref 150–400)
RBC: 2.9 MIL/uL — AB (ref 4.22–5.81)
RDW: 14.6 % (ref 11.5–15.5)
WBC: 4 10*3/uL (ref 4.0–10.5)
nRBC: 0 % (ref 0.0–0.2)

## 2018-09-17 DIAGNOSIS — R69 Illness, unspecified: Secondary | ICD-10-CM | POA: Diagnosis not present

## 2018-09-18 DIAGNOSIS — R69 Illness, unspecified: Secondary | ICD-10-CM | POA: Diagnosis not present

## 2018-09-21 ENCOUNTER — Other Ambulatory Visit: Payer: Self-pay | Admitting: Oncology

## 2018-09-23 ENCOUNTER — Other Ambulatory Visit: Payer: Self-pay | Admitting: *Deleted

## 2018-09-23 DIAGNOSIS — D45 Polycythemia vera: Secondary | ICD-10-CM

## 2018-09-23 MED ORDER — HYDROXYUREA 500 MG PO CAPS: ORAL_CAPSULE | ORAL | 0 refills | Status: DC

## 2018-09-26 ENCOUNTER — Inpatient Hospital Stay: Payer: Medicare HMO | Admitting: Oncology

## 2018-09-26 ENCOUNTER — Inpatient Hospital Stay: Payer: Medicare HMO | Attending: Oncology

## 2018-09-26 ENCOUNTER — Telehealth: Payer: Self-pay | Admitting: Oncology

## 2018-09-26 ENCOUNTER — Other Ambulatory Visit: Payer: Self-pay

## 2018-09-26 ENCOUNTER — Inpatient Hospital Stay: Payer: Medicare HMO

## 2018-09-26 VITALS — BP 127/70 | HR 77 | Temp 98.0°F | Resp 17 | Ht 70.0 in | Wt 191.7 lb

## 2018-09-26 DIAGNOSIS — D45 Polycythemia vera: Secondary | ICD-10-CM

## 2018-09-26 DIAGNOSIS — D72829 Elevated white blood cell count, unspecified: Secondary | ICD-10-CM

## 2018-09-26 DIAGNOSIS — Z79899 Other long term (current) drug therapy: Secondary | ICD-10-CM

## 2018-09-26 LAB — CBC WITH DIFFERENTIAL (CANCER CENTER ONLY)
Abs Immature Granulocytes: 0.02 10*3/uL (ref 0.00–0.07)
Basophils Absolute: 0 10*3/uL (ref 0.0–0.1)
Basophils Relative: 1 %
Eosinophils Absolute: 0 10*3/uL (ref 0.0–0.5)
Eosinophils Relative: 1 %
HCT: 35.3 % — ABNORMAL LOW (ref 39.0–52.0)
Hemoglobin: 12.6 g/dL — ABNORMAL LOW (ref 13.0–17.0)
Immature Granulocytes: 1 %
Lymphocytes Relative: 21 %
Lymphs Abs: 0.9 10*3/uL (ref 0.7–4.0)
MCH: 41 pg — AB (ref 26.0–34.0)
MCHC: 35.7 g/dL (ref 30.0–36.0)
MCV: 115 fL — ABNORMAL HIGH (ref 80.0–100.0)
MONO ABS: 0.5 10*3/uL (ref 0.1–1.0)
Monocytes Relative: 12 %
Neutro Abs: 2.7 10*3/uL (ref 1.7–7.7)
Neutrophils Relative %: 64 %
Platelet Count: 198 10*3/uL (ref 150–400)
RBC: 3.07 MIL/uL — ABNORMAL LOW (ref 4.22–5.81)
RDW: 13.2 % (ref 11.5–15.5)
WBC: 4.1 10*3/uL (ref 4.0–10.5)
nRBC: 0 % (ref 0.0–0.2)

## 2018-09-26 NOTE — Telephone Encounter (Signed)
Gave avs and calendar ° °

## 2018-09-26 NOTE — Progress Notes (Signed)
Hematology and Oncology Follow Up Visit  Linton Stolp [id number] June 04, 1946 73 y.o. 09/26/2018 1:54 PM [name], MDRoss, [name], MD   Principle Diagnosis: 73 year old man with polycythemia vera diagnosed in May 2018.  He presented with JAK2 positive mutation and erythrocytosis.    Current therapy:  Phlebotomy on a monthly basis to keep his hemoglobin below 45.  Hydroxyurea 500 mg daily started on 03/01/2017.  His dose was increased to 1000 mg in September 2019.  Interim History: Mr. Pereira returns today for a follow-up visit.  Since the last visit, he reports no major changes in his health.  He continues to tolerate hydroxyurea at 1000 mg daily without any concerns.  He denies any excessive fatigue, tiredness.  Denies any thrombosis or bleeding complications.  Denies any recent hospitalizations or illnesses.   Patient denied any alteration mental status, neuropathy, confusion or dizziness.  Denies any headaches or lethargy.  Denies any night sweats, weight loss or changes in appetite.  Denied orthopnea, dyspnea on exertion or chest discomfort.  Denies shortness of breath, difficulty breathing hemoptysis or cough.  Denies any abdominal distention, nausea, early satiety or dyspepsia.  Denies any hematuria, frequency, dysuria or nocturia.  Denies any skin irritation, dryness or rash.  Denies any ecchymosis or petechiae.  Denies any lymphadenopathy or clotting.  Denies any heat or cold intolerance.  Denies any anxiety or depression.  Remaining review of system is negative.     Medications: I have reviewed the patient's current medications.  Current Outpatient Medications  Medication Sig Dispense Refill  . aspirin 81 MG tablet Take 81 mg by mouth daily.      . Calcium-Magnesium-Vitamin D (CALCIUM 500 PO) Take 1 tablet by mouth 2 (two) times daily.    . cetirizine (ZYRTEC) 10 MG tablet Take 10 mg by mouth daily.    . cholecalciferol (VITAMIN D) 1000 UNITS tablet Take  2,000 Units by mouth daily.     . famotidine (PEPCID) 20 MG tablet Take 20 mg by mouth 2 (two) times daily.    . hydroxyurea (HYDREA) 500 MG capsule TAKE 1 CAPSULE BY MOUTH TWICE DAILY... MAY TAKE WITH FOOD TO MINIMIZE GI SIDE EFFECTS 180 capsule 0  . hydroxyurea (HYDREA) 500 MG capsule TAKE 1 CAPSULE(500 MG) BY MOUTH DAILY. MAY TAKE WITH FOOD TO MINIMIZE GI SIDE EFFECTS 180 capsule 0  . irbesartan (AVAPRO) 300 MG tablet Take 300 mg by mouth daily.  3  . KRILL OIL PO Take 500 mg by mouth daily.     . predniSONE (DELTASONE) 1 MG tablet Take 3 mg by mouth daily.  2  . Saw Palmetto, Serenoa repens, (SAW PALMETTO PO) Take 450 mg by mouth daily.      No current facility-administered medications for this visit.      Allergies: No Known Allergies  Past Medical History, Surgical history, Social history, and Family History updated without change.  Physical Exam:  Blood pressure 127/70, pulse 77, temperature 98 F (36.7 C), temperature source Oral, resp. rate 17, height 5\' 10"  (1.778 m), weight 191 lb 11.2 oz (87 kg), SpO2 100 %.   ECOG: 0   General appearance: Alert, awake without any distress. Head: Atraumatic without abnormalities Oropharynx: Without any thrush or ulcers. Eyes: No scleral icterus. Lymph nodes: No lymphadenopathy noted in the cervical, supraclavicular, or axillary nodes Heart:regular rate and rhythm, without any murmurs or gallops.   Lung: Clear to auscultation without any rhonchi, wheezes or dullness to percussion. Abdomin: Soft, nontender without any  shifting dullness or ascites. Musculoskeletal: No clubbing or cyanosis. Neurological: No motor or sensory deficits. Skin: No rashes or lesions.    Lab Results: Lab Results  Component Value Date   WBC 4.0 08/22/2018   HGB 11.8 (L) 08/22/2018   HCT 33.5 (L) 08/22/2018   MCV 115.5 (H) 08/22/2018   PLT 186 08/22/2018     Chemistry      Component Value Date/Time   NA 137 12/27/2017 1321   NA 139 06/28/2017 1451    K 5.2 (H) 12/27/2017 1321   K 5.5 No visable hemolysis (H) 06/28/2017 1451   CL 106 12/27/2017 1321   CO2 24 12/27/2017 1321   CO2 24 06/28/2017 1451   BUN 23 12/27/2017 1321   BUN 24.0 06/28/2017 1451   CREATININE 1.25 12/27/2017 1321   CREATININE 1.2 06/28/2017 1451      Component Value Date/Time   CALCIUM 9.2 12/27/2017 1321   CALCIUM 9.4 06/28/2017 1451   ALKPHOS 67 12/27/2017 1321   ALKPHOS 74 06/28/2017 1451   AST 14 12/27/2017 1321   AST 19 06/28/2017 1451   ALT 13 12/27/2017 1321   ALT 19 06/28/2017 1451   BILITOT 0.7 12/27/2017 1321   BILITOT 1.01 06/28/2017 1451        Impression and Plan:  1.  Polycythemia vera diagnosed in May 2018.    He has been receiving intermittent phlebotomy to keep his hematocrit below 45 which she has tolerated without any complications.  He has not required any phlebotomy since November 2019.  His hemoglobin today continues to be adequate and does not require any phlebotomy.  It is very possible on the current dose of hydroxyurea he might not require any further phlebotomy but will continue to monitor closely.  2. Leukocytosis and thrombocytosis: Related to his polycythemia vera and myeloproliferative disorder.  He is currently on hydroxyurea which has been increased to 500 mg twice a day since last visit.  His CBC for the last 6 months showed dramatic increase in his white cell count and platelet count indicating a proper response to hydroxyurea.  The drop at this time appears to be slightly more drastic I have recommended adjusting his hydroxyurea dosing moving forward.  I recommended taking hydroxyurea 1000 mg Monday through Friday.  He will take 500 mg on Saturday and Sunday.  3. Thrombosis prophylaxis: No bleeding or thrombosis noted at this time.  I recommended continuing aspirin.  4. Follow-up: We will be in 3 months to repeat his counts and in 6 months for MD follow-up.  15  minutes was spent with the patient face-to-face today.   More than 50% of time was spent on reviewing his disease status, treatment options and addressing complications related to therapy.     Zola Button, MD 3/13/20201:54 PM

## 2018-10-06 ENCOUNTER — Other Ambulatory Visit: Payer: Self-pay | Admitting: Family Medicine

## 2018-10-06 DIAGNOSIS — Z1159 Encounter for screening for other viral diseases: Secondary | ICD-10-CM | POA: Diagnosis not present

## 2018-10-06 DIAGNOSIS — M8580 Other specified disorders of bone density and structure, unspecified site: Secondary | ICD-10-CM | POA: Diagnosis not present

## 2018-10-06 DIAGNOSIS — M353 Polymyalgia rheumatica: Secondary | ICD-10-CM | POA: Diagnosis not present

## 2018-10-06 DIAGNOSIS — E782 Mixed hyperlipidemia: Secondary | ICD-10-CM | POA: Diagnosis not present

## 2018-10-06 DIAGNOSIS — L570 Actinic keratosis: Secondary | ICD-10-CM | POA: Diagnosis not present

## 2018-10-06 DIAGNOSIS — Z0000 Encounter for general adult medical examination without abnormal findings: Secondary | ICD-10-CM | POA: Diagnosis not present

## 2018-10-06 DIAGNOSIS — K219 Gastro-esophageal reflux disease without esophagitis: Secondary | ICD-10-CM | POA: Diagnosis not present

## 2018-10-06 DIAGNOSIS — I10 Essential (primary) hypertension: Secondary | ICD-10-CM | POA: Diagnosis not present

## 2018-10-09 DIAGNOSIS — R972 Elevated prostate specific antigen [PSA]: Secondary | ICD-10-CM | POA: Diagnosis not present

## 2018-10-16 DIAGNOSIS — N400 Enlarged prostate without lower urinary tract symptoms: Secondary | ICD-10-CM | POA: Diagnosis not present

## 2018-10-16 DIAGNOSIS — R972 Elevated prostate specific antigen [PSA]: Secondary | ICD-10-CM | POA: Diagnosis not present

## 2018-10-20 DIAGNOSIS — L570 Actinic keratosis: Secondary | ICD-10-CM | POA: Diagnosis not present

## 2018-12-26 ENCOUNTER — Inpatient Hospital Stay: Payer: Medicare HMO

## 2018-12-26 ENCOUNTER — Other Ambulatory Visit: Payer: Self-pay

## 2018-12-26 ENCOUNTER — Inpatient Hospital Stay: Payer: Medicare HMO | Attending: Oncology

## 2018-12-26 DIAGNOSIS — D72829 Elevated white blood cell count, unspecified: Secondary | ICD-10-CM | POA: Insufficient documentation

## 2018-12-26 DIAGNOSIS — D45 Polycythemia vera: Secondary | ICD-10-CM

## 2018-12-26 DIAGNOSIS — Z79899 Other long term (current) drug therapy: Secondary | ICD-10-CM | POA: Diagnosis not present

## 2018-12-26 LAB — CMP (CANCER CENTER ONLY)
ALT: 25 U/L (ref 0–44)
AST: 21 U/L (ref 15–41)
Albumin: 4.2 g/dL (ref 3.5–5.0)
Alkaline Phosphatase: 55 U/L (ref 38–126)
Anion gap: 9 (ref 5–15)
BUN: 22 mg/dL (ref 8–23)
CO2: 24 mmol/L (ref 22–32)
Calcium: 9.1 mg/dL (ref 8.9–10.3)
Chloride: 106 mmol/L (ref 98–111)
Creatinine: 1.43 mg/dL — ABNORMAL HIGH (ref 0.61–1.24)
GFR, Est AFR Am: 56 mL/min — ABNORMAL LOW (ref >60)
GFR, Estimated: 48 mL/min — ABNORMAL LOW (ref >60)
Glucose, Bld: 124 mg/dL — ABNORMAL HIGH (ref 70–99)
Potassium: 4.7 mmol/L (ref 3.5–5.1)
Sodium: 139 mmol/L (ref 135–145)
Total Bilirubin: 0.8 mg/dL (ref 0.3–1.2)
Total Protein: 7 g/dL (ref 6.5–8.1)

## 2018-12-26 LAB — CBC WITH DIFFERENTIAL (CANCER CENTER ONLY)
Abs Immature Granulocytes: 0.04 10*3/uL (ref 0.00–0.07)
Basophils Absolute: 0 10*3/uL (ref 0.0–0.1)
Basophils Relative: 1 %
Eosinophils Absolute: 0.1 10*3/uL (ref 0.0–0.5)
Eosinophils Relative: 1 %
HCT: 39.7 % (ref 39.0–52.0)
Hemoglobin: 13.6 g/dL (ref 13.0–17.0)
Immature Granulocytes: 1 %
Lymphocytes Relative: 21 %
Lymphs Abs: 1.1 10*3/uL (ref 0.7–4.0)
MCH: 39.1 pg — ABNORMAL HIGH (ref 26.0–34.0)
MCHC: 34.3 g/dL (ref 30.0–36.0)
MCV: 114.1 fL — ABNORMAL HIGH (ref 80.0–100.0)
Monocytes Absolute: 0.6 10*3/uL (ref 0.1–1.0)
Monocytes Relative: 11 %
Neutro Abs: 3.2 10*3/uL (ref 1.7–7.7)
Neutrophils Relative %: 65 %
Platelet Count: 242 10*3/uL (ref 150–400)
RBC: 3.48 MIL/uL — ABNORMAL LOW (ref 4.22–5.81)
RDW: 13.4 % (ref 11.5–15.5)
WBC Count: 5 10*3/uL (ref 4.0–10.5)
nRBC: 0 % (ref 0.0–0.2)

## 2018-12-26 NOTE — Progress Notes (Signed)
Patient did not meet phlebotomy parameters. Patient denies having any issues. Phlebotomy not performed today. Will notify Dr. Alen Blew.

## 2019-02-02 ENCOUNTER — Ambulatory Visit
Admission: RE | Admit: 2019-02-02 | Discharge: 2019-02-02 | Disposition: A | Discharge status: Home / Self Care | Payer: Medicare HMO | Source: Ambulatory Visit | Attending: Family Medicine | Admitting: Family Medicine

## 2019-02-02 ENCOUNTER — Other Ambulatory Visit: Payer: Self-pay

## 2019-02-02 DIAGNOSIS — M85852 Other specified disorders of bone density and structure, left thigh: Secondary | ICD-10-CM | POA: Diagnosis not present

## 2019-02-02 DIAGNOSIS — M8580 Other specified disorders of bone density and structure, unspecified site: Secondary | ICD-10-CM

## 2019-02-17 DIAGNOSIS — Z7952 Long term (current) use of systemic steroids: Secondary | ICD-10-CM | POA: Diagnosis not present

## 2019-02-17 DIAGNOSIS — M2550 Pain in unspecified joint: Secondary | ICD-10-CM | POA: Diagnosis not present

## 2019-02-17 DIAGNOSIS — M353 Polymyalgia rheumatica: Secondary | ICD-10-CM | POA: Diagnosis not present

## 2019-02-17 DIAGNOSIS — D751 Secondary polycythemia: Secondary | ICD-10-CM | POA: Diagnosis not present

## 2019-03-03 DIAGNOSIS — R69 Illness, unspecified: Secondary | ICD-10-CM | POA: Diagnosis not present

## 2019-03-24 DIAGNOSIS — R69 Illness, unspecified: Secondary | ICD-10-CM | POA: Diagnosis not present

## 2019-03-25 ENCOUNTER — Other Ambulatory Visit: Payer: Self-pay

## 2019-03-25 MED ORDER — HYDROXYUREA 500 MG PO CAPS: ORAL_CAPSULE | ORAL | 0 refills | Status: DC

## 2019-03-26 DIAGNOSIS — R69 Illness, unspecified: Secondary | ICD-10-CM | POA: Diagnosis not present

## 2019-04-02 ENCOUNTER — Other Ambulatory Visit: Payer: Self-pay

## 2019-04-02 DIAGNOSIS — D45 Polycythemia vera: Secondary | ICD-10-CM

## 2019-04-03 ENCOUNTER — Inpatient Hospital Stay: Payer: Medicare HMO

## 2019-04-03 ENCOUNTER — Inpatient Hospital Stay: Payer: Medicare HMO | Attending: Oncology | Admitting: Oncology

## 2019-04-03 ENCOUNTER — Other Ambulatory Visit: Payer: Self-pay

## 2019-04-03 VITALS — BP 129/74 | HR 75 | Temp 98.7°F | Resp 17 | Ht 70.0 in | Wt 185.1 lb

## 2019-04-03 DIAGNOSIS — Z7982 Long term (current) use of aspirin: Secondary | ICD-10-CM | POA: Diagnosis not present

## 2019-04-03 DIAGNOSIS — Z79899 Other long term (current) drug therapy: Secondary | ICD-10-CM | POA: Insufficient documentation

## 2019-04-03 DIAGNOSIS — D45 Polycythemia vera: Secondary | ICD-10-CM | POA: Diagnosis not present

## 2019-04-03 DIAGNOSIS — D72829 Elevated white blood cell count, unspecified: Secondary | ICD-10-CM | POA: Diagnosis not present

## 2019-04-03 LAB — CMP (CANCER CENTER ONLY)
ALT: 30 U/L (ref 0–44)
AST: 28 U/L (ref 15–41)
Albumin: 4.5 g/dL (ref 3.5–5.0)
Alkaline Phosphatase: 80 U/L (ref 38–126)
Anion gap: 9 (ref 5–15)
BUN: 15 mg/dL (ref 8–23)
CO2: 24 mmol/L (ref 22–32)
Calcium: 9.4 mg/dL (ref 8.9–10.3)
Chloride: 107 mmol/L (ref 98–111)
Creatinine: 1.05 mg/dL (ref 0.61–1.24)
GFR, Est AFR Am: >60 mL/min (ref >60)
GFR, Estimated: >60 mL/min (ref >60)
Glucose, Bld: 93 mg/dL (ref 70–99)
Potassium: 4.6 mmol/L (ref 3.5–5.1)
Sodium: 140 mmol/L (ref 135–145)
Total Bilirubin: 0.8 mg/dL (ref 0.3–1.2)
Total Protein: 7.1 g/dL (ref 6.5–8.1)

## 2019-04-03 LAB — CBC WITH DIFFERENTIAL (CANCER CENTER ONLY)
Abs Immature Granulocytes: 0.02 10*3/uL (ref 0.00–0.07)
Basophils Absolute: 0 10*3/uL (ref 0.0–0.1)
Basophils Relative: 1 %
Eosinophils Absolute: 0.1 10*3/uL (ref 0.0–0.5)
Eosinophils Relative: 2 %
HCT: 39.3 % (ref 39.0–52.0)
Hemoglobin: 13.7 g/dL (ref 13.0–17.0)
Immature Granulocytes: 1 %
Lymphocytes Relative: 21 %
Lymphs Abs: 0.9 10*3/uL (ref 0.7–4.0)
MCH: 39.4 pg — ABNORMAL HIGH (ref 26.0–34.0)
MCHC: 34.9 g/dL (ref 30.0–36.0)
MCV: 112.9 fL — ABNORMAL HIGH (ref 80.0–100.0)
Monocytes Absolute: 0.4 10*3/uL (ref 0.1–1.0)
Monocytes Relative: 10 %
Neutro Abs: 2.9 10*3/uL (ref 1.7–7.7)
Neutrophils Relative %: 65 %
Platelet Count: 250 10*3/uL (ref 150–400)
RBC: 3.48 MIL/uL — ABNORMAL LOW (ref 4.22–5.81)
RDW: 13.2 % (ref 11.5–15.5)
WBC Count: 4.4 10*3/uL (ref 4.0–10.5)
nRBC: 0 % (ref 0.0–0.2)

## 2019-04-03 NOTE — Progress Notes (Signed)
Hematology and Oncology Follow Up Visit  Joshua Ryan [id number] 04-Jul-1946 73 y.o. 04/03/2019 12:59 PM [name], MDRoss, [name], MD   Principle Diagnosis: 73 year old man with Jak 2+ myeloproliferative disorder diagnosed in 2018.  He was found to have polycythemia vera with erythrocytosis.    Current therapy:  Phlebotomy on a monthly basis to keep his hemoglobin below 45.  His last phlebotomy given in November 2019.  Hydroxyurea 500 mg daily started on 03/01/2017.  His dose was increased to 1000 mg in September 2019.  He is currently taking 1000 mg daily 5 days a week and 500 mg on Saturday and Sunday.  Interim History: [name] is here for a repeat evaluation.  Since last visit, he continues to tolerate hydroxyurea with the outlined schedule utilizing 1000 mg daily 5 out of 7 days a week.  He takes 500 mg daily the other days.  He denies any complications related to this regimen.  He denies any nausea, fatigue or neuropathy.  He remains active and continues to attend activities of daily living.   He denied headaches, blurry vision, syncope or seizures.  Denies any fevers, chills or sweats.  Denied chest pain, palpitation, orthopnea or leg edema.  Denied cough, wheezing or hemoptysis.  Denied nausea, vomiting or abdominal pain.  Denies any constipation or diarrhea.  Denies any frequency urgency or hesitancy.  Denies any arthralgias or myalgias.  Denies any skin rashes or lesions.  Denies any bleeding or clotting tendency.  Denies any easy bruising.  Denies any hair or nail changes.  Denies any anxiety or depression.  Remaining review of system is negative.     Medications: Reviewed and updated at this time. Current Outpatient Medications  Medication Sig Dispense Refill  . aspirin 81 MG tablet Take 81 mg by mouth daily.      . Calcium-Magnesium-Vitamin D (CALCIUM 500 PO) Take 1 tablet by mouth 2 (two) times daily.    . cetirizine (ZYRTEC) 10 MG tablet Take 10  mg by mouth daily.    . cholecalciferol (VITAMIN D) 1000 UNITS tablet Take 2,000 Units by mouth daily.     . famotidine (PEPCID) 20 MG tablet Take 20 mg by mouth 2 (two) times daily.    . hydroxyurea (HYDREA) 500 MG capsule TAKE 1 CAPSULE BY MOUTH TWICE DAILY & 1 CAP ON SAT & SUN,  MAY TAKE WITH FOOD TO MINIMIZE GI SIDE EFFECTS 180 capsule 0  . irbesartan (AVAPRO) 300 MG tablet Take 300 mg by mouth daily.  3  . KRILL OIL PO Take 500 mg by mouth daily.     . predniSONE (DELTASONE) 1 MG tablet Take 3 mg by mouth daily.  2  . Saw Palmetto, Serenoa repens, (SAW PALMETTO PO) Take 450 mg by mouth daily.      No current facility-administered medications for this visit.      Allergies: No Known Allergies  Past Medical History, Surgical history, Social history, and Family History without any changes on review.  Physical Exam:  Blood pressure 129/74, pulse 75, temperature 98.7 F (37.1 C), temperature source Oral, resp. rate 17, height 5\' 10"  (1.778 m), weight 185 lb 1.6 oz (84 kg), SpO2 100 %.    ECOG: 0    General appearance: Comfortable appearing without any discomfort Head: Normocephalic without any trauma Oropharynx: Mucous membranes are moist and pink without any thrush or ulcers. Eyes: Pupils are equal and round reactive to light. Lymph nodes: No cervical, supraclavicular, inguinal or axillary lymphadenopathy.  Heart:regular rate and rhythm.  S1 and S2 without leg edema. Lung: Clear without any rhonchi or wheezes.  No dullness to percussion. Abdomin: Soft, nontender, nondistended with good bowel sounds.  No hepatosplenomegaly. Musculoskeletal: No joint deformity or effusion.  Full range of motion noted. Neurological: No deficits noted on motor, sensory and deep tendon reflex exam. Skin: No petechial rash or dryness.  Appeared moist.      Lab Results: Lab Results  Component Value Date   WBC 5.0 12/26/2018   HGB 13.6 12/26/2018   HCT 39.7 12/26/2018   MCV 114.1 (H)  12/26/2018   PLT 242 12/26/2018     Chemistry      Component Value Date/Time   NA 139 12/26/2018 1338   NA 139 06/28/2017 1451   K 4.7 12/26/2018 1338   K 5.5 No visable hemolysis (H) 06/28/2017 1451   CL 106 12/26/2018 1338   CO2 24 12/26/2018 1338   CO2 24 06/28/2017 1451   BUN 22 12/26/2018 1338   BUN 24.0 06/28/2017 1451   CREATININE 1.43 (H) 12/26/2018 1338   CREATININE 1.2 06/28/2017 1451      Component Value Date/Time   CALCIUM 9.1 12/26/2018 1338   CALCIUM 9.4 06/28/2017 1451   ALKPHOS 55 12/26/2018 1338   ALKPHOS 74 06/28/2017 1451   AST 21 12/26/2018 1338   AST 19 06/28/2017 1451   ALT 25 12/26/2018 1338   ALT 19 06/28/2017 1451   BILITOT 0.8 12/26/2018 1338   BILITOT 1.01 06/28/2017 1451        Impression and Plan:  73 year old man with:  1.  Myeloproliferative disorder that is Jak 2+ diagnosed in May 2018.  He presented with erythrocytosis and polycythemia vera.   He is currently on hydroxyurea with as needed phlebotomy.  He has tolerated the current dose of hydroxyurea without any issues and his counts remain adequate.  He has not required phlebotomy since November 2019.  The natural course of this disease was reviewed again and risks and benefits of continuing the current dose of hydroxyurea were reviewed.  After discussion, he is agreeable to continue at this time and phlebotomy will be deferred unless he develops hematocrit above 45 in the future.  Potential complications associated with hydroxyurea were reiterated which include bone marrow disease, pruritus, oral ulcers and fatigue.  2. Leukocytosis and thrombocytosis: Counts improved at this time with the current dose of hydroxyurea.  No adjustment needed.   3. Thrombosis prophylaxis: No thrombosis or bleeding episodes noted at this time.  Continues to be on low-dose aspirin.  4. Follow-up: In 3 months for repeat laboratory testing and MD follow-up in 6 months.  25  minutes was spent with the patient  face-to-face today.  More than 50% of time was dedicated to reviewing his disease status, treatment options and complications related to his current therapy.     Zola Button, MD 9/18/202012:59 PM

## 2019-04-06 ENCOUNTER — Telehealth: Payer: Self-pay | Admitting: Oncology

## 2019-04-06 NOTE — Telephone Encounter (Signed)
No los nor sch message per 9/18. °

## 2019-06-19 ENCOUNTER — Other Ambulatory Visit: Payer: Self-pay | Admitting: Oncology

## 2019-06-22 DIAGNOSIS — M720 Palmar fascial fibromatosis [Dupuytren]: Secondary | ICD-10-CM | POA: Diagnosis not present

## 2019-06-22 DIAGNOSIS — D751 Secondary polycythemia: Secondary | ICD-10-CM | POA: Diagnosis not present

## 2019-06-22 DIAGNOSIS — M353 Polymyalgia rheumatica: Secondary | ICD-10-CM | POA: Diagnosis not present

## 2019-06-22 DIAGNOSIS — Z7952 Long term (current) use of systemic steroids: Secondary | ICD-10-CM | POA: Diagnosis not present

## 2019-06-22 DIAGNOSIS — M2550 Pain in unspecified joint: Secondary | ICD-10-CM | POA: Diagnosis not present

## 2019-09-14 DIAGNOSIS — R69 Illness, unspecified: Secondary | ICD-10-CM | POA: Diagnosis not present

## 2019-09-21 DIAGNOSIS — E663 Overweight: Secondary | ICD-10-CM | POA: Diagnosis not present

## 2019-09-21 DIAGNOSIS — Z6826 Body mass index (BMI) 26.0-26.9, adult: Secondary | ICD-10-CM | POA: Diagnosis not present

## 2019-09-21 DIAGNOSIS — D751 Secondary polycythemia: Secondary | ICD-10-CM | POA: Diagnosis not present

## 2019-09-21 DIAGNOSIS — M2550 Pain in unspecified joint: Secondary | ICD-10-CM | POA: Diagnosis not present

## 2019-09-21 DIAGNOSIS — Z7952 Long term (current) use of systemic steroids: Secondary | ICD-10-CM | POA: Diagnosis not present

## 2019-09-21 DIAGNOSIS — M353 Polymyalgia rheumatica: Secondary | ICD-10-CM | POA: Diagnosis not present

## 2019-10-19 DIAGNOSIS — R972 Elevated prostate specific antigen [PSA]: Secondary | ICD-10-CM | POA: Diagnosis not present

## 2019-10-26 DIAGNOSIS — N400 Enlarged prostate without lower urinary tract symptoms: Secondary | ICD-10-CM | POA: Diagnosis not present

## 2019-10-26 DIAGNOSIS — R972 Elevated prostate specific antigen [PSA]: Secondary | ICD-10-CM | POA: Diagnosis not present

## 2019-11-22 ENCOUNTER — Other Ambulatory Visit: Payer: Self-pay | Admitting: Oncology

## 2019-12-24 DIAGNOSIS — E782 Mixed hyperlipidemia: Secondary | ICD-10-CM | POA: Diagnosis not present

## 2019-12-24 DIAGNOSIS — Z0000 Encounter for general adult medical examination without abnormal findings: Secondary | ICD-10-CM | POA: Diagnosis not present

## 2019-12-24 DIAGNOSIS — I10 Essential (primary) hypertension: Secondary | ICD-10-CM | POA: Diagnosis not present

## 2019-12-24 DIAGNOSIS — M720 Palmar fascial fibromatosis [Dupuytren]: Secondary | ICD-10-CM | POA: Diagnosis not present

## 2019-12-24 DIAGNOSIS — K219 Gastro-esophageal reflux disease without esophagitis: Secondary | ICD-10-CM | POA: Diagnosis not present

## 2019-12-24 DIAGNOSIS — D45 Polycythemia vera: Secondary | ICD-10-CM | POA: Diagnosis not present

## 2019-12-30 ENCOUNTER — Telehealth: Payer: Self-pay | Admitting: Oncology

## 2019-12-30 ENCOUNTER — Other Ambulatory Visit: Payer: Self-pay | Admitting: Oncology

## 2019-12-30 NOTE — Telephone Encounter (Signed)
Scheduled apt per 6/16 sch message - no answer and no vmail . Mailed reminder letter with appt date and time

## 2020-01-25 DIAGNOSIS — M720 Palmar fascial fibromatosis [Dupuytren]: Secondary | ICD-10-CM | POA: Diagnosis not present

## 2020-01-25 DIAGNOSIS — M353 Polymyalgia rheumatica: Secondary | ICD-10-CM | POA: Diagnosis not present

## 2020-01-25 DIAGNOSIS — Z7952 Long term (current) use of systemic steroids: Secondary | ICD-10-CM | POA: Diagnosis not present

## 2020-01-25 DIAGNOSIS — E663 Overweight: Secondary | ICD-10-CM | POA: Diagnosis not present

## 2020-01-25 DIAGNOSIS — Z6826 Body mass index (BMI) 26.0-26.9, adult: Secondary | ICD-10-CM | POA: Diagnosis not present

## 2020-01-25 DIAGNOSIS — D751 Secondary polycythemia: Secondary | ICD-10-CM | POA: Diagnosis not present

## 2020-01-29 DIAGNOSIS — M720 Palmar fascial fibromatosis [Dupuytren]: Secondary | ICD-10-CM | POA: Diagnosis not present

## 2020-01-29 DIAGNOSIS — M65341 Trigger finger, right ring finger: Secondary | ICD-10-CM | POA: Diagnosis not present

## 2020-01-29 DIAGNOSIS — M65312 Trigger thumb, left thumb: Secondary | ICD-10-CM | POA: Diagnosis not present

## 2020-02-18 ENCOUNTER — Inpatient Hospital Stay: Payer: Medicare HMO

## 2020-02-18 ENCOUNTER — Inpatient Hospital Stay: Payer: Medicare HMO | Attending: Oncology | Admitting: Oncology

## 2020-02-18 ENCOUNTER — Other Ambulatory Visit: Payer: Self-pay

## 2020-02-18 VITALS — BP 135/87 | HR 82 | Temp 97.7°F | Resp 18 | Ht 70.0 in | Wt 189.2 lb

## 2020-02-18 DIAGNOSIS — D45 Polycythemia vera: Secondary | ICD-10-CM | POA: Insufficient documentation

## 2020-02-18 DIAGNOSIS — D72829 Elevated white blood cell count, unspecified: Secondary | ICD-10-CM | POA: Diagnosis not present

## 2020-02-18 LAB — CBC WITH DIFFERENTIAL (CANCER CENTER ONLY)
Abs Immature Granulocytes: 0.01 10*3/uL (ref 0.00–0.07)
Basophils Absolute: 0 10*3/uL (ref 0.0–0.1)
Basophils Relative: 1 %
Eosinophils Absolute: 0 10*3/uL (ref 0.0–0.5)
Eosinophils Relative: 1 %
HCT: 36.3 % — ABNORMAL LOW (ref 39.0–52.0)
Hemoglobin: 12.8 g/dL — ABNORMAL LOW (ref 13.0–17.0)
Immature Granulocytes: 0 %
Lymphocytes Relative: 27 %
Lymphs Abs: 0.8 10*3/uL (ref 0.7–4.0)
MCH: 37.6 pg — ABNORMAL HIGH (ref 26.0–34.0)
MCHC: 35.3 g/dL (ref 30.0–36.0)
MCV: 106.8 fL — ABNORMAL HIGH (ref 80.0–100.0)
Monocytes Absolute: 0.5 10*3/uL (ref 0.1–1.0)
Monocytes Relative: 16 %
Neutro Abs: 1.8 10*3/uL (ref 1.7–7.7)
Neutrophils Relative %: 55 %
Platelet Count: 153 10*3/uL (ref 150–400)
RBC: 3.4 MIL/uL — ABNORMAL LOW (ref 4.22–5.81)
RDW: 13.7 % (ref 11.5–15.5)
WBC Count: 3.2 10*3/uL — ABNORMAL LOW (ref 4.0–10.5)
nRBC: 0 % (ref 0.0–0.2)

## 2020-02-18 NOTE — Progress Notes (Signed)
Hematology and Oncology Follow Up Visit  Joshua Ryan [id number] 1946-06-14 74 y.o. 02/18/2020 12:55 PM Joshua Ryan, MDRoss, Joshua Luo, MD   Principle Diagnosis: 74 year old man with polycythemia vera diagnosed in 2018.  He was found to have Jak 2 positive mutation.    Current therapy:  Phlebotomy on a monthly basis to keep his hemoglobin below 45.  His last phlebotomy given in November 2019.  Hydroxyurea 500 mg daily started on 03/01/2017.  His dose was increased to 1000 mg in September 2019.  He is currently taking 1000 mg daily 5 days a week and 500 mg on Saturday and Sunday.  Interim History: Joshua Ryan is here for return evaluation.  Since the last visit, he reports no major changes in his health.  He continues to take hydroxyurea at the current schedule without any new complaints.  He denies any nausea, fatigue or oral ulcers.  He denies any bone pain or pathological fractures.  His performance status and quality of life remain excellent.     Medications: Unchanged on review. Current Outpatient Medications  Medication Sig Dispense Refill  . aspirin 81 MG tablet Take 81 mg by mouth daily.      . Calcium-Magnesium-Vitamin D (CALCIUM 500 PO) Take 1 tablet by mouth 2 (two) times daily.    . cetirizine (ZYRTEC) 10 MG tablet Take 10 mg by mouth daily.    . cholecalciferol (VITAMIN D) 1000 UNITS tablet Take 2,000 Units by mouth daily.     . famotidine (PEPCID) 20 MG tablet Take 20 mg by mouth 2 (two) times daily.    . hydroxyurea (HYDREA) 500 MG capsule TAKE 1 CAPSULE BY MOUTH TWICE DAILY. TAKE 1 CAPSULE ON SATURDAYS AND SUNDAYS. MAY TAKE WITH FOOD TO MINIMIZE GI SIDE EFFECTS. 180 capsule 0  . irbesartan (AVAPRO) 300 MG tablet Take 300 mg by mouth daily.  3  . KRILL OIL PO Take 500 mg by mouth daily.     . predniSONE (DELTASONE) 1 MG tablet Take 3 mg by mouth daily.  2  . Saw Palmetto, Serenoa repens, (SAW PALMETTO PO) Take 450 mg by mouth daily.      No current  facility-administered medications for this visit.     Allergies: No Known Allergies    Physical Exam:  Blood pressure 135/87, pulse 82, temperature 97.7 F (36.5 C), temperature source Temporal, resp. rate 18, height 5\' 10"  (1.778 m), weight 189 lb 3.2 oz (85.8 kg), SpO2 100 %.     ECOG: 0   General appearance: Alert, awake without any distress. Head: Atraumatic without abnormalities Oropharynx: Without any thrush or ulcers. Eyes: No scleral icterus. Lymph nodes: No lymphadenopathy noted in the cervical, supraclavicular, or axillary nodes Heart:regular rate and rhythm, without any murmurs or gallops.   Lung: Clear to auscultation without any rhonchi, wheezes or dullness to percussion. Abdomin: Soft, nontender without any shifting dullness or ascites. Musculoskeletal: No clubbing or cyanosis. Neurological: No motor or sensory deficits. Skin: No rashes or lesions.      Lab Results: Lab Results  Component Value Date   WBC 4.4 04/03/2019   HGB 13.7 04/03/2019   HCT 39.3 04/03/2019   MCV 112.9 (H) 04/03/2019   PLT 250 04/03/2019     Chemistry      Component Value Date/Time   NA 140 04/03/2019 1250   NA 139 06/28/2017 1451   K 4.6 04/03/2019 1250   K 5.5 No visable hemolysis (H) 06/28/2017 1451   CL 107 04/03/2019 1250  CO2 24 04/03/2019 1250   CO2 24 06/28/2017 1451   BUN 15 04/03/2019 1250   BUN 24.0 06/28/2017 1451   CREATININE 1.05 04/03/2019 1250   CREATININE 1.2 06/28/2017 1451      Component Value Date/Time   CALCIUM 9.4 04/03/2019 1250   CALCIUM 9.4 06/28/2017 1451   ALKPHOS 80 04/03/2019 1250   ALKPHOS 74 06/28/2017 1451   AST 28 04/03/2019 1250   AST 19 06/28/2017 1451   ALT 30 04/03/2019 1250   ALT 19 06/28/2017 1451   BILITOT 0.8 04/03/2019 1250   BILITOT 1.01 06/28/2017 1451        Impression and Plan:  74 year old man with:  1.  Jak 2 positive myeloproliferative disorder presented with polycythemia in May 2018.    The natural  course of this disease was updated today and treatment options were reviewed.  He continues to be on hydroxyurea with excellent control of his counts.  Risks and benefits of continuing this treatment and the role of intermittent phlebotomy was also reviewed.    Laboratory data from today reviewed and showed further decline in his white cell count and platelets as well as his hemoglobin.  Based on these findings I recommended reducing his hydroxyurea dose to 500 mg daily for the time being.  We will reevaluate him in 3 months.  2. Leukocytosis and thrombocytosis: Reasonably controlled with hydroxyurea at this time..   3. Thrombosis prophylaxis: His risk of thrombosis remains low given his controlled thrombocytosis and leukocytosis.  He is currently on aspirin.  4. Follow-up: In 3 months for repeat laboratory testing.  30  minutes were dedicated to this visit. The time was spent on reviewing laboratory data, discussing treatment options, and answering questions regarding future plan.      Joshua Button, MD 8/5/202112:55 PM

## 2020-02-23 ENCOUNTER — Telehealth: Payer: Self-pay | Admitting: Oncology

## 2020-02-23 NOTE — Telephone Encounter (Signed)
Scheduled per 08/05 los, patient has been called and notified. 

## 2020-02-26 DIAGNOSIS — M65342 Trigger finger, left ring finger: Secondary | ICD-10-CM | POA: Diagnosis not present

## 2020-02-26 DIAGNOSIS — M720 Palmar fascial fibromatosis [Dupuytren]: Secondary | ICD-10-CM | POA: Diagnosis not present

## 2020-02-26 DIAGNOSIS — M65331 Trigger finger, right middle finger: Secondary | ICD-10-CM | POA: Diagnosis not present

## 2020-02-28 ENCOUNTER — Other Ambulatory Visit: Payer: Self-pay | Admitting: Oncology

## 2020-04-19 DIAGNOSIS — R69 Illness, unspecified: Secondary | ICD-10-CM | POA: Diagnosis not present

## 2020-04-20 DIAGNOSIS — R69 Illness, unspecified: Secondary | ICD-10-CM | POA: Diagnosis not present

## 2020-05-06 DIAGNOSIS — M65331 Trigger finger, right middle finger: Secondary | ICD-10-CM | POA: Diagnosis not present

## 2020-05-06 DIAGNOSIS — M65342 Trigger finger, left ring finger: Secondary | ICD-10-CM | POA: Diagnosis not present

## 2020-05-06 DIAGNOSIS — M720 Palmar fascial fibromatosis [Dupuytren]: Secondary | ICD-10-CM | POA: Diagnosis not present

## 2020-05-12 DIAGNOSIS — R69 Illness, unspecified: Secondary | ICD-10-CM | POA: Diagnosis not present

## 2020-05-17 ENCOUNTER — Other Ambulatory Visit: Payer: Medicare HMO

## 2020-05-17 ENCOUNTER — Ambulatory Visit: Payer: Medicare HMO | Admitting: Oncology

## 2020-05-18 ENCOUNTER — Ambulatory Visit: Payer: Medicare HMO | Admitting: Oncology

## 2020-05-18 ENCOUNTER — Other Ambulatory Visit: Payer: Medicare HMO

## 2020-05-30 DIAGNOSIS — D751 Secondary polycythemia: Secondary | ICD-10-CM | POA: Diagnosis not present

## 2020-05-30 DIAGNOSIS — M720 Palmar fascial fibromatosis [Dupuytren]: Secondary | ICD-10-CM | POA: Diagnosis not present

## 2020-05-30 DIAGNOSIS — Z6826 Body mass index (BMI) 26.0-26.9, adult: Secondary | ICD-10-CM | POA: Diagnosis not present

## 2020-05-30 DIAGNOSIS — Z7952 Long term (current) use of systemic steroids: Secondary | ICD-10-CM | POA: Diagnosis not present

## 2020-05-30 DIAGNOSIS — E663 Overweight: Secondary | ICD-10-CM | POA: Diagnosis not present

## 2020-05-30 DIAGNOSIS — M353 Polymyalgia rheumatica: Secondary | ICD-10-CM | POA: Diagnosis not present

## 2020-05-30 DIAGNOSIS — M064 Inflammatory polyarthropathy: Secondary | ICD-10-CM | POA: Diagnosis not present

## 2020-06-01 ENCOUNTER — Other Ambulatory Visit: Payer: Self-pay | Admitting: Oncology

## 2020-06-01 DIAGNOSIS — D45 Polycythemia vera: Secondary | ICD-10-CM

## 2020-06-02 ENCOUNTER — Other Ambulatory Visit: Payer: Self-pay

## 2020-06-02 ENCOUNTER — Inpatient Hospital Stay: Payer: Medicare HMO | Attending: Oncology

## 2020-06-02 ENCOUNTER — Inpatient Hospital Stay: Payer: Medicare HMO | Admitting: Oncology

## 2020-06-02 VITALS — BP 115/71 | HR 93 | Temp 99.1°F | Resp 18 | Ht 70.0 in | Wt 190.5 lb

## 2020-06-02 DIAGNOSIS — D72829 Elevated white blood cell count, unspecified: Secondary | ICD-10-CM | POA: Diagnosis not present

## 2020-06-02 DIAGNOSIS — D45 Polycythemia vera: Secondary | ICD-10-CM

## 2020-06-02 DIAGNOSIS — Z7982 Long term (current) use of aspirin: Secondary | ICD-10-CM | POA: Diagnosis not present

## 2020-06-02 DIAGNOSIS — D75839 Thrombocytosis, unspecified: Secondary | ICD-10-CM | POA: Insufficient documentation

## 2020-06-02 LAB — CBC WITH DIFFERENTIAL (CANCER CENTER ONLY)
Abs Immature Granulocytes: 0.05 10*3/uL (ref 0.00–0.07)
Basophils Absolute: 0 10*3/uL (ref 0.0–0.1)
Basophils Relative: 1 %
Eosinophils Absolute: 0.1 10*3/uL (ref 0.0–0.5)
Eosinophils Relative: 2 %
HCT: 37.3 % — ABNORMAL LOW (ref 39.0–52.0)
Hemoglobin: 12.7 g/dL — ABNORMAL LOW (ref 13.0–17.0)
Immature Granulocytes: 1 %
Lymphocytes Relative: 17 %
Lymphs Abs: 0.9 10*3/uL (ref 0.7–4.0)
MCH: 37.2 pg — ABNORMAL HIGH (ref 26.0–34.0)
MCHC: 34 g/dL (ref 30.0–36.0)
MCV: 109.4 fL — ABNORMAL HIGH (ref 80.0–100.0)
Monocytes Absolute: 0.5 10*3/uL (ref 0.1–1.0)
Monocytes Relative: 10 %
Neutro Abs: 3.6 10*3/uL (ref 1.7–7.7)
Neutrophils Relative %: 69 %
Platelet Count: 241 10*3/uL (ref 150–400)
RBC: 3.41 MIL/uL — ABNORMAL LOW (ref 4.22–5.81)
RDW: 12.4 % (ref 11.5–15.5)
WBC Count: 5.2 10*3/uL (ref 4.0–10.5)
nRBC: 0 % (ref 0.0–0.2)

## 2020-06-02 LAB — CMP (CANCER CENTER ONLY)
ALT: 34 U/L (ref 0–44)
AST: 33 U/L (ref 15–41)
Albumin: 4 g/dL (ref 3.5–5.0)
Alkaline Phosphatase: 53 U/L (ref 38–126)
Anion gap: 7 (ref 5–15)
BUN: 24 mg/dL — ABNORMAL HIGH (ref 8–23)
CO2: 27 mmol/L (ref 22–32)
Calcium: 9.1 mg/dL (ref 8.9–10.3)
Chloride: 105 mmol/L (ref 98–111)
Creatinine: 1.62 mg/dL — ABNORMAL HIGH (ref 0.61–1.24)
GFR, Estimated: 44 mL/min — ABNORMAL LOW (ref >60)
Glucose, Bld: 140 mg/dL — ABNORMAL HIGH (ref 70–99)
Potassium: 4.7 mmol/L (ref 3.5–5.1)
Sodium: 139 mmol/L (ref 135–145)
Total Bilirubin: 0.7 mg/dL (ref 0.3–1.2)
Total Protein: 7 g/dL (ref 6.5–8.1)

## 2020-06-02 NOTE — Progress Notes (Signed)
Hematology and Oncology Follow Up Visit  Joshua Ryan [id number] 28-Jul-1945 74 y.o. 06/02/2020 2:55 PM Joshua Ryan, MDRoss, Joshua Luo, MD   Principle Diagnosis: 74 year old man with JAK2 positive polycythemia vera diagnosed in 2018.  .    Current therapy:  Phlebotomy on a monthly basis to keep his hemoglobin below 45.  His last phlebotomy given in November 2019.  Hydroxyurea 500 mg daily started on 03/01/2017.  His dose was increased to 1000 mg in September 2019.    He is currently taking 500 mg daily due to cytopenia.  Interim History: Joshua Ryan is here for repeat evaluation.  Since the last visit, he reports no major changes in his health.  He has tolerated hydroxyurea at the current dose without any complications.  He denies any nausea vomiting or abdominal pain.  He denies any recent thrombosis or bleeding.     Medications: Updated on review. Current Outpatient Medications  Medication Sig Dispense Refill  . aspirin 81 MG tablet Take 81 mg by mouth daily.      . Calcium-Magnesium-Vitamin D (CALCIUM 500 PO) Take 1 tablet by mouth 2 (two) times daily.    . cetirizine (ZYRTEC) 10 MG tablet Take 10 mg by mouth daily.    . cholecalciferol (VITAMIN D) 1000 UNITS tablet Take 2,000 Units by mouth daily.     . famotidine (PEPCID) 20 MG tablet Take 20 mg by mouth 2 (two) times daily.    . hydroxyurea (HYDREA) 500 MG capsule TAKE 1 CAPSULE BY MOUTH TWICE DAILY. TAKE 1 CAPSULE ON SATURDAYS AND SUNDAYS. MAY TAKE WITH FOOD TO MINIMIZE GI SIDE EFFECTS. 180 capsule 0  . irbesartan (AVAPRO) 300 MG tablet Take 300 mg by mouth daily.  3  . KRILL OIL PO Take 500 mg by mouth daily.     . predniSONE (DELTASONE) 1 MG tablet Take 3 mg by mouth daily.  2  . Saw Palmetto, Serenoa repens, (SAW PALMETTO PO) Take 450 mg by mouth daily.      No current facility-administered medications for this visit.     Allergies: No Known Allergies    Physical Exam:    Blood pressure  115/71, pulse 93, temperature 99.1 F (37.3 C), temperature source Tympanic, resp. rate 18, height 5\' 10"  (1.778 m), weight 190 lb 8 oz (86.4 kg), SpO2 99 %.    ECOG: 0    General appearance: Comfortable appearing without any discomfort Head: Normocephalic without any trauma Oropharynx: Mucous membranes are moist and pink without any thrush or ulcers. Eyes: Pupils are equal and round reactive to light. Lymph nodes: No cervical, supraclavicular, inguinal or axillary lymphadenopathy.   Heart:regular rate and rhythm.  S1 and S2 without leg edema. Lung: Clear without any rhonchi or wheezes.  No dullness to percussion. Abdomin: Soft, nontender, nondistended with good bowel sounds.  No hepatosplenomegaly. Musculoskeletal: No joint deformity or effusion.  Full range of motion noted. Neurological: No deficits noted on motor, sensory and deep tendon reflex exam. Skin: No petechial rash or dryness.  Appeared moist.        Lab Results: Lab Results  Component Value Date   WBC 3.2 (L) 02/18/2020   HGB 12.8 (L) 02/18/2020   HCT 36.3 (L) 02/18/2020   MCV 106.8 (H) 02/18/2020   PLT 153 02/18/2020     Chemistry      Component Value Date/Time   NA 140 04/03/2019 1250   NA 139 06/28/2017 1451   K 4.6 04/03/2019 1250   K 5.5 No  visable hemolysis (H) 06/28/2017 1451   CL 107 04/03/2019 1250   CO2 24 04/03/2019 1250   CO2 24 06/28/2017 1451   BUN 15 04/03/2019 1250   BUN 24.0 06/28/2017 1451   CREATININE 1.05 04/03/2019 1250   CREATININE 1.2 06/28/2017 1451      Component Value Date/Time   CALCIUM 9.4 04/03/2019 1250   CALCIUM 9.4 06/28/2017 1451   ALKPHOS 80 04/03/2019 1250   ALKPHOS 74 06/28/2017 1451   AST 28 04/03/2019 1250   AST 19 06/28/2017 1451   ALT 30 04/03/2019 1250   ALT 19 06/28/2017 1451   BILITOT 0.8 04/03/2019 1250   BILITOT 1.01 06/28/2017 1451        Impression and Plan:  74 year old man with:  1.  Polycythemia vera diagnosed in 2018.  He was found to  have Jak 2 positive myeloproliferative disorder     He is currently on hydroxyurea although with fluctuating doses because of cytopenias.  He is currently taking 500 mg daily without any major complications.  Risks and benefits of continuing the current dose versus dose adjustment were reviewed.  The role of phlebotomy was also discussed at this time.  Laboratory data from today reviewed and showed adequate counts and excellent control of his hematological parameters.  He is not requiring phlebotomy or adjusting of his hydroxyurea.  I recommended continuing hydroxyurea 500 mg daily.    2. Leukocytosis and thrombocytosis: Related to polycythemia vera and managed with hydroxyurea.   3. Thrombosis prophylaxis: I recommended continuing aspirin for the time being.  No thrombosis or bleeding noted at this time.  4. Follow-up: Will be in 6 months for repeat follow-up.  30  minutes were spent on this encounter.  The time was dedicated to reviewing his disease status, reviewing laboratory data and complications of doing his therapies.      Zola Button, MD 11/18/20212:55 PM

## 2020-08-23 ENCOUNTER — Other Ambulatory Visit: Payer: Self-pay | Admitting: Oncology

## 2020-08-23 MED ORDER — HYDROXYUREA 500 MG PO CAPS: ORAL_CAPSULE | ORAL | 6 refills | Status: DC

## 2020-08-30 ENCOUNTER — Other Ambulatory Visit: Payer: Self-pay

## 2020-08-30 ENCOUNTER — Telehealth: Payer: Self-pay

## 2020-08-30 MED ORDER — HYDROXYUREA 500 MG PO CAPS: ORAL_CAPSULE | ORAL | 6 refills | Status: DC

## 2020-08-30 NOTE — Telephone Encounter (Signed)
Rx refill hydroxyurea (HYDREA) 500 MG capsule sent to [facility] on file.

## 2020-09-02 ENCOUNTER — Telehealth: Payer: Self-pay | Admitting: *Deleted

## 2020-09-02 ENCOUNTER — Other Ambulatory Visit: Payer: Self-pay | Admitting: *Deleted

## 2020-09-02 MED ORDER — HYDROXYUREA 500 MG PO CAPS: ORAL_CAPSULE | ORAL | 6 refills | Status: DC

## 2020-09-02 NOTE — Telephone Encounter (Signed)
Received call back from patient. Advised that the refill for his Hydrea was escribed to his pharmacy earlier this afternoon. He voiced understanding and will call them to see if it is ready to pick up.

## 2020-09-02 NOTE — Telephone Encounter (Signed)
Received vm message from patient requesting a refill. He did not specify which medication he needed refilled.  TCT patient. No answer but was able to leave vm message for pt to call back [phone]

## 2020-09-28 DIAGNOSIS — D751 Secondary polycythemia: Secondary | ICD-10-CM | POA: Diagnosis not present

## 2020-09-28 DIAGNOSIS — M064 Inflammatory polyarthropathy: Secondary | ICD-10-CM | POA: Diagnosis not present

## 2020-09-28 DIAGNOSIS — M25512 Pain in left shoulder: Secondary | ICD-10-CM | POA: Diagnosis not present

## 2020-09-28 DIAGNOSIS — M159 Polyosteoarthritis, unspecified: Secondary | ICD-10-CM | POA: Diagnosis not present

## 2020-09-28 DIAGNOSIS — Z7952 Long term (current) use of systemic steroids: Secondary | ICD-10-CM | POA: Diagnosis not present

## 2020-09-28 DIAGNOSIS — M353 Polymyalgia rheumatica: Secondary | ICD-10-CM | POA: Diagnosis not present

## 2020-09-28 DIAGNOSIS — E663 Overweight: Secondary | ICD-10-CM | POA: Diagnosis not present

## 2020-09-28 DIAGNOSIS — Z6825 Body mass index (BMI) 25.0-25.9, adult: Secondary | ICD-10-CM | POA: Diagnosis not present

## 2020-10-20 DIAGNOSIS — R972 Elevated prostate specific antigen [PSA]: Secondary | ICD-10-CM | POA: Diagnosis not present

## 2020-10-27 DIAGNOSIS — N400 Enlarged prostate without lower urinary tract symptoms: Secondary | ICD-10-CM | POA: Diagnosis not present

## 2020-10-27 DIAGNOSIS — R972 Elevated prostate specific antigen [PSA]: Secondary | ICD-10-CM | POA: Diagnosis not present

## 2020-12-01 ENCOUNTER — Inpatient Hospital Stay (HOSPITAL_BASED_OUTPATIENT_CLINIC_OR_DEPARTMENT_OTHER): Payer: Medicare HMO | Admitting: Oncology

## 2020-12-01 ENCOUNTER — Inpatient Hospital Stay: Payer: Medicare HMO | Attending: Oncology

## 2020-12-01 ENCOUNTER — Other Ambulatory Visit: Payer: Self-pay

## 2020-12-01 VITALS — BP 107/91 | HR 80 | Temp 97.1°F | Resp 18 | Wt 186.1 lb

## 2020-12-01 DIAGNOSIS — D45 Polycythemia vera: Secondary | ICD-10-CM | POA: Insufficient documentation

## 2020-12-01 DIAGNOSIS — D75839 Thrombocytosis, unspecified: Secondary | ICD-10-CM | POA: Insufficient documentation

## 2020-12-01 DIAGNOSIS — Z7982 Long term (current) use of aspirin: Secondary | ICD-10-CM | POA: Insufficient documentation

## 2020-12-01 DIAGNOSIS — D72829 Elevated white blood cell count, unspecified: Secondary | ICD-10-CM | POA: Diagnosis not present

## 2020-12-01 LAB — CBC WITH DIFFERENTIAL (CANCER CENTER ONLY)
Abs Immature Granulocytes: 0.11 10*3/uL — ABNORMAL HIGH (ref 0.00–0.07)
Basophils Absolute: 0.1 10*3/uL (ref 0.0–0.1)
Basophils Relative: 1 %
Eosinophils Absolute: 0.3 10*3/uL (ref 0.0–0.5)
Eosinophils Relative: 3 %
HCT: 48.4 % (ref 39.0–52.0)
Hemoglobin: 16.8 g/dL (ref 13.0–17.0)
Immature Granulocytes: 1 %
Lymphocytes Relative: 9 %
Lymphs Abs: 0.8 10*3/uL (ref 0.7–4.0)
MCH: 35.9 pg — ABNORMAL HIGH (ref 26.0–34.0)
MCHC: 34.7 g/dL (ref 30.0–36.0)
MCV: 103.4 fL — ABNORMAL HIGH (ref 80.0–100.0)
Monocytes Absolute: 0.6 10*3/uL (ref 0.1–1.0)
Monocytes Relative: 7 %
Neutro Abs: 7.5 10*3/uL (ref 1.7–7.7)
Neutrophils Relative %: 79 %
Platelet Count: 380 10*3/uL (ref 150–400)
RBC: 4.68 MIL/uL (ref 4.22–5.81)
RDW: 14.6 % (ref 11.5–15.5)
WBC Count: 9.3 10*3/uL (ref 4.0–10.5)
nRBC: 0 % (ref 0.0–0.2)

## 2020-12-01 LAB — CMP (CANCER CENTER ONLY)
ALT: 31 U/L (ref 0–44)
AST: 31 U/L (ref 15–41)
Albumin: 4.2 g/dL (ref 3.5–5.0)
Alkaline Phosphatase: 71 U/L (ref 38–126)
Anion gap: 10 (ref 5–15)
BUN: 22 mg/dL (ref 8–23)
CO2: 26 mmol/L (ref 22–32)
Calcium: 9.6 mg/dL (ref 8.9–10.3)
Chloride: 103 mmol/L (ref 98–111)
Creatinine: 1.09 mg/dL (ref 0.61–1.24)
GFR, Estimated: >60 mL/min (ref >60)
Glucose, Bld: 125 mg/dL — ABNORMAL HIGH (ref 70–99)
Potassium: 4.9 mmol/L (ref 3.5–5.1)
Sodium: 139 mmol/L (ref 135–145)
Total Bilirubin: 1 mg/dL (ref 0.3–1.2)
Total Protein: 7.4 g/dL (ref 6.5–8.1)

## 2020-12-01 NOTE — Progress Notes (Signed)
Hematology and Oncology Follow Up Visit  Joshua Ryan [id number] June 02, 1946 74 y.o. 12/01/2020 12:43 PM Joshua Ryan, MDRoss, Joshua Luo, MD   Principle Diagnosis: 75 year old man with polycythemia vera diagnosed in 2018.  He was found to have JAK2 positive myeloproliferative disorder.    Prior therapy:   Phlebotomy on a monthly basis to keep his hemoglobin below 45.  His last phlebotomy given in November 2019.  Hydroxyurea 500 mg daily started on 03/01/2017.  His dose was increased to 1000 mg in September 2019.    Current therapy:     He is currently taking 500 mg daily.   Interim History: Joshua Ryan returns today for repeat evaluation and follow-up.  Since the last visit, he reports no major changes in his health.  He continues to tolerate hydroxyurea without any major complaints at this time.  He denies any nausea, fatigue or bleeding complications.  He denies any easy bruising or any hospitalizations.     Medications: Unchanged on review. Current Outpatient Medications  Medication Sig Dispense Refill  . aspirin 81 MG tablet Take 81 mg by mouth daily.      . Calcium-Magnesium-Vitamin D (CALCIUM 500 PO) Take 1 tablet by mouth 2 (two) times daily.    . cetirizine (ZYRTEC) 10 MG tablet Take 10 mg by mouth daily.    . cholecalciferol (VITAMIN D) 1000 UNITS tablet Take 2,000 Units by mouth daily.     . famotidine (PEPCID) 20 MG tablet Take 20 mg by mouth 2 (two) times daily.    . hydroxyurea (HYDREA) 500 MG capsule Patient to take 1 capsule daily by mouth.May take with food to minimize GI side effects. 180 capsule 6  . irbesartan (AVAPRO) 300 MG tablet Take 300 mg by mouth daily.  3  . KRILL OIL PO Take 500 mg by mouth daily.     . predniSONE (DELTASONE) 1 MG tablet Take 3 mg by mouth daily.  2  . Saw Palmetto, Serenoa repens, (SAW PALMETTO PO) Take 450 mg by mouth daily.      No current facility-administered medications for this visit.     Allergies: No  Known Allergies    Physical Exam:  Blood pressure (!) 107/91, pulse 80, temperature (!) 97.1 F (36.2 C), temperature source Tympanic, resp. rate 18, weight 186 lb 1.6 oz (84.4 kg), SpO2 98 %.       ECOG: 0   General appearance: Alert, awake without any distress. Head: Atraumatic without abnormalities Oropharynx: Without any thrush or ulcers. Eyes: No scleral icterus. Lymph nodes: No lymphadenopathy noted in the cervical, supraclavicular, or axillary nodes Heart:regular rate and rhythm, without any murmurs or gallops.   Lung: Clear to auscultation without any rhonchi, wheezes or dullness to percussion. Abdomin: Soft, nontender without any shifting dullness or ascites. Musculoskeletal: No clubbing or cyanosis. Neurological: No motor or sensory deficits. Skin: No rashes or lesions.       Lab Results: Lab Results  Component Value Date   WBC 9.3 12/01/2020   HGB 16.8 12/01/2020   HCT 48.4 12/01/2020   MCV 103.4 (H) 12/01/2020   PLT 380 12/01/2020     Chemistry      Component Value Date/Time   NA 139 06/02/2020 1445   NA 139 06/28/2017 1451   K 4.7 06/02/2020 1445   K 5.5 No visable hemolysis (H) 06/28/2017 1451   CL 105 06/02/2020 1445   CO2 27 06/02/2020 1445   CO2 24 06/28/2017 1451   BUN 24 (H) 06/02/2020  1445   BUN 24.0 06/28/2017 1451   CREATININE 1.62 (H) 06/02/2020 1445   CREATININE 1.2 06/28/2017 1451      Component Value Date/Time   CALCIUM 9.1 06/02/2020 1445   CALCIUM 9.4 06/28/2017 1451   ALKPHOS 53 06/02/2020 1445   ALKPHOS 74 06/28/2017 1451   AST 33 06/02/2020 1445   AST 19 06/28/2017 1451   ALT 34 06/02/2020 1445   ALT 19 06/28/2017 1451   BILITOT 0.7 06/02/2020 1445   BILITOT 1.01 06/28/2017 1451        Impression and Plan:  75 year old man with:  1.  Jak 2 positive myeloproliferative disorder presented with polycythemia vera.  His disease status was updated at this time and treatment options were reviewed.  Risks and  benefits of continuing hydroxyurea were discussed at this time and he is currently taking it successfully.  Laboratory data from today showed adequate hematological parameters including white cells, red cells and platelets.  He does have mild elevation in his hematocrit but overall his disease under control.  I recommended continued hydroxyurea and deferring the option for phlebotomy unless he develops higher hemoglobin level.  He is agreeable to continue at this time.    2. Leukocytosis and thrombocytosis: Manageable with hydroxyurea at this time.   3. Thrombosis prophylaxis: Remains low at this time with counts under control.  4. Follow-up: In 6 months for repeat follow-up.  30  minutes were dedicated to this visit.  The time was spent on reviewing laboratory data, disease status update and addressing treatment options and complications related to therapy.      Zola Button, MD 5/19/202212:43 PM

## 2021-02-21 DIAGNOSIS — M720 Palmar fascial fibromatosis [Dupuytren]: Secondary | ICD-10-CM | POA: Diagnosis not present

## 2021-02-21 DIAGNOSIS — M159 Polyosteoarthritis, unspecified: Secondary | ICD-10-CM | POA: Diagnosis not present

## 2021-02-21 DIAGNOSIS — Z6825 Body mass index (BMI) 25.0-25.9, adult: Secondary | ICD-10-CM | POA: Diagnosis not present

## 2021-02-21 DIAGNOSIS — Z7952 Long term (current) use of systemic steroids: Secondary | ICD-10-CM | POA: Diagnosis not present

## 2021-02-21 DIAGNOSIS — M064 Inflammatory polyarthropathy: Secondary | ICD-10-CM | POA: Diagnosis not present

## 2021-02-21 DIAGNOSIS — M353 Polymyalgia rheumatica: Secondary | ICD-10-CM | POA: Diagnosis not present

## 2021-02-21 DIAGNOSIS — E663 Overweight: Secondary | ICD-10-CM | POA: Diagnosis not present

## 2021-03-10 DIAGNOSIS — K219 Gastro-esophageal reflux disease without esophagitis: Secondary | ICD-10-CM | POA: Diagnosis not present

## 2021-03-10 DIAGNOSIS — I10 Essential (primary) hypertension: Secondary | ICD-10-CM | POA: Diagnosis not present

## 2021-03-10 DIAGNOSIS — Z23 Encounter for immunization: Secondary | ICD-10-CM | POA: Diagnosis not present

## 2021-03-10 DIAGNOSIS — Z1389 Encounter for screening for other disorder: Secondary | ICD-10-CM | POA: Diagnosis not present

## 2021-03-10 DIAGNOSIS — Z0000 Encounter for general adult medical examination without abnormal findings: Secondary | ICD-10-CM | POA: Diagnosis not present

## 2021-03-10 DIAGNOSIS — E782 Mixed hyperlipidemia: Secondary | ICD-10-CM | POA: Diagnosis not present

## 2021-06-01 ENCOUNTER — Inpatient Hospital Stay: Payer: Medicare HMO | Admitting: Oncology

## 2021-06-01 ENCOUNTER — Other Ambulatory Visit: Payer: Self-pay

## 2021-06-01 ENCOUNTER — Inpatient Hospital Stay: Payer: Medicare HMO | Attending: Oncology

## 2021-06-01 VITALS — BP 145/82 | HR 85 | Temp 98.7°F | Resp 18 | Ht 70.0 in | Wt 185.4 lb

## 2021-06-01 DIAGNOSIS — D75839 Thrombocytosis, unspecified: Secondary | ICD-10-CM | POA: Diagnosis not present

## 2021-06-01 DIAGNOSIS — D45 Polycythemia vera: Secondary | ICD-10-CM

## 2021-06-01 DIAGNOSIS — Z79899 Other long term (current) drug therapy: Secondary | ICD-10-CM | POA: Insufficient documentation

## 2021-06-01 DIAGNOSIS — D72829 Elevated white blood cell count, unspecified: Secondary | ICD-10-CM | POA: Diagnosis not present

## 2021-06-01 LAB — CBC WITH DIFFERENTIAL (CANCER CENTER ONLY)
Abs Immature Granulocytes: 0.1 10*3/uL — ABNORMAL HIGH (ref 0.00–0.07)
Basophils Absolute: 0.1 10*3/uL (ref 0.0–0.1)
Basophils Relative: 1 %
Eosinophils Absolute: 0.2 10*3/uL (ref 0.0–0.5)
Eosinophils Relative: 3 %
HCT: 42.4 % (ref 39.0–52.0)
Hemoglobin: 14.7 g/dL (ref 13.0–17.0)
Immature Granulocytes: 2 %
Lymphocytes Relative: 12 %
Lymphs Abs: 0.7 10*3/uL (ref 0.7–4.0)
MCH: 37.5 pg — ABNORMAL HIGH (ref 26.0–34.0)
MCHC: 34.7 g/dL (ref 30.0–36.0)
MCV: 108.2 fL — ABNORMAL HIGH (ref 80.0–100.0)
Monocytes Absolute: 0.4 10*3/uL (ref 0.1–1.0)
Monocytes Relative: 7 %
Neutro Abs: 4.2 10*3/uL (ref 1.7–7.7)
Neutrophils Relative %: 75 %
Platelet Count: 322 10*3/uL (ref 150–400)
RBC: 3.92 MIL/uL — ABNORMAL LOW (ref 4.22–5.81)
RDW: 13.9 % (ref 11.5–15.5)
WBC Count: 5.6 10*3/uL (ref 4.0–10.5)
nRBC: 0 % (ref 0.0–0.2)

## 2021-06-01 LAB — CMP (CANCER CENTER ONLY)
ALT: 25 U/L (ref 0–44)
AST: 26 U/L (ref 15–41)
Albumin: 4.4 g/dL (ref 3.5–5.0)
Alkaline Phosphatase: 61 U/L (ref 38–126)
Anion gap: 9 (ref 5–15)
BUN: 14 mg/dL (ref 8–23)
CO2: 25 mmol/L (ref 22–32)
Calcium: 9.6 mg/dL (ref 8.9–10.3)
Chloride: 106 mmol/L (ref 98–111)
Creatinine: 1.15 mg/dL (ref 0.61–1.24)
GFR, Estimated: >60 mL/min (ref >60)
Glucose, Bld: 155 mg/dL — ABNORMAL HIGH (ref 70–99)
Potassium: 5.1 mmol/L (ref 3.5–5.1)
Sodium: 140 mmol/L (ref 135–145)
Total Bilirubin: 1.1 mg/dL (ref 0.3–1.2)
Total Protein: 7.2 g/dL (ref 6.5–8.1)

## 2021-06-01 NOTE — Progress Notes (Signed)
Hematology and Oncology Follow Up Visit  Joshua Ryan [id number] Mar 11, 1946 75 y.o. 06/01/2021 10:22 AM Joshua Ryan, MDRoss, Joshua Luo, MD   Principle Diagnosis: 75 year old man with JAK2 positive on proliferative disorder diagnosed in 2018.  He was found to have polycythemia vera at that time.    Prior therapy:   Phlebotomy on a monthly basis to keep his hemoglobin below 45.  His last phlebotomy given in November 2019.  Hydroxyurea 500 mg daily started on 03/01/2017.  His dose was increased to 1000 mg in September 2019.    Current therapy:     He is currently taking 500 mg daily.   Interim History: Joshua Ryan returns today for a follow-up visit.  Since the last visit, he reports no major changes in his health.  He denies any recent hospitalizations or illnesses.  He denies any chest pain or breathing difficulties.  He denies any bleeding or thrombosis.  He continues to tolerate hydroxyurea without any other complaints.     Medications: Reviewed without changes. Current Outpatient Medications  Medication Sig Dispense Refill   aspirin 81 MG tablet Take 81 mg by mouth daily.     Calcium-Magnesium-Vitamin D (CALCIUM 500 PO) Take 1 tablet by mouth 2 (two) times daily.     cetirizine (ZYRTEC) 10 MG tablet Take 10 mg by mouth daily.     cholecalciferol (VITAMIN D) 1000 UNITS tablet Take 2,000 Units by mouth daily.     famotidine (PEPCID) 20 MG tablet Take 20 mg by mouth 2 (two) times daily.     hydroxyurea (HYDREA) 500 MG capsule Patient to take 1 capsule daily by mouth.May take with food to minimize GI side effects. 180 capsule 6   irbesartan (AVAPRO) 300 MG tablet Take 300 mg by mouth daily.  3   KRILL OIL PO Take 500 mg by mouth daily.     predniSONE (DELTASONE) 1 MG tablet Take 3 mg by mouth daily.  2   Saw Palmetto, Serenoa repens, (SAW PALMETTO PO) Take 450 mg by mouth daily.     No current facility-administered medications for this visit.      Allergies: No Known Allergies    Physical Exam:     Blood pressure (!) 145/82, pulse 85, temperature 98.7 F (37.1 C), temperature source Oral, resp. rate 18, height 5\' 10"  (1.778 m), weight 185 lb 6.4 oz (84.1 kg), SpO2 100 %.     ECOG: 0     General appearance: Alert, awake without any distress. Head: Atraumatic without abnormalities Oropharynx: Without any thrush or ulcers. Eyes: No scleral icterus. Lymph nodes: No lymphadenopathy noted in the cervical, supraclavicular, or axillary nodes Heart:regular rate and rhythm, without any murmurs or gallops.   Lung: Clear to auscultation without any rhonchi, wheezes or dullness to percussion. Abdomin: Soft, nontender without any shifting dullness or ascites. Musculoskeletal: No clubbing or cyanosis. Neurological: No motor or sensory deficits. Skin: No rashes or lesions.          Lab Results: Lab Results  Component Value Date   WBC 9.3 12/01/2020   HGB 16.8 12/01/2020   HCT 48.4 12/01/2020   MCV 103.4 (H) 12/01/2020   PLT 380 12/01/2020     Chemistry      Component Value Date/Time   NA 139 12/01/2020 1233   NA 139 06/28/2017 1451   K 4.9 12/01/2020 1233   K 5.5 No visable hemolysis (H) 06/28/2017 1451   CL 103 12/01/2020 1233   CO2 26 12/01/2020 1233  CO2 24 06/28/2017 1451   BUN 22 12/01/2020 1233   BUN 24.0 06/28/2017 1451   CREATININE 1.09 12/01/2020 1233   CREATININE 1.2 06/28/2017 1451      Component Value Date/Time   CALCIUM 9.6 12/01/2020 1233   CALCIUM 9.4 06/28/2017 1451   ALKPHOS 71 12/01/2020 1233   ALKPHOS 74 06/28/2017 1451   AST 31 12/01/2020 1233   AST 19 06/28/2017 1451   ALT 31 12/01/2020 1233   ALT 19 06/28/2017 1451   BILITOT 1.0 12/01/2020 1233   BILITOT 1.01 06/28/2017 1451        Impression and Plan:  75 year old man with:  1.  Polycythemia vera diagnosed in 2018.  He presented with Jak 2 positive mutation.  Laboratory data from today reviewed and continues  to show excellent disease control at this time.  His hemoglobin is 14.7 and does not require any additional treatment including phlebotomy.  Risks and benefits of continuing hydroxyurea were reviewed.  He is agreeable to continue at this time.      2. Leukocytosis and thrombocytosis: Both are within normal range with hydroxyurea.  No adjustment is needed.  We will   3. Thrombosis prophylaxis: No evidence of thrombosis noted at this time and risk is low.  4. Follow-up: He will return in 6 months for repeat follow-up.  30  minutes were dedicated to this visit.  The time was spent on reviewing laboratory data, disease status update and addressing treatment options and complications related to therapy.       Zola Button, MD 11/17/202210:22 AM

## 2021-06-05 DIAGNOSIS — R7301 Impaired fasting glucose: Secondary | ICD-10-CM | POA: Diagnosis not present

## 2021-08-14 DIAGNOSIS — Z86010 Personal history of colonic polyps: Secondary | ICD-10-CM | POA: Diagnosis not present

## 2021-08-14 DIAGNOSIS — K219 Gastro-esophageal reflux disease without esophagitis: Secondary | ICD-10-CM | POA: Diagnosis not present

## 2021-08-29 DIAGNOSIS — D751 Secondary polycythemia: Secondary | ICD-10-CM | POA: Diagnosis not present

## 2021-08-29 DIAGNOSIS — Z7952 Long term (current) use of systemic steroids: Secondary | ICD-10-CM | POA: Diagnosis not present

## 2021-08-29 DIAGNOSIS — E663 Overweight: Secondary | ICD-10-CM | POA: Diagnosis not present

## 2021-08-29 DIAGNOSIS — M064 Inflammatory polyarthropathy: Secondary | ICD-10-CM | POA: Diagnosis not present

## 2021-08-29 DIAGNOSIS — Z6825 Body mass index (BMI) 25.0-25.9, adult: Secondary | ICD-10-CM | POA: Diagnosis not present

## 2021-08-29 DIAGNOSIS — M353 Polymyalgia rheumatica: Secondary | ICD-10-CM | POA: Diagnosis not present

## 2021-09-13 DIAGNOSIS — R21 Rash and other nonspecific skin eruption: Secondary | ICD-10-CM | POA: Diagnosis not present

## 2021-09-13 DIAGNOSIS — L308 Other specified dermatitis: Secondary | ICD-10-CM | POA: Diagnosis not present

## 2021-09-13 DIAGNOSIS — L2089 Other atopic dermatitis: Secondary | ICD-10-CM | POA: Diagnosis not present

## 2021-09-13 DIAGNOSIS — D225 Melanocytic nevi of trunk: Secondary | ICD-10-CM | POA: Diagnosis not present

## 2021-10-09 DIAGNOSIS — M329 Systemic lupus erythematosus, unspecified: Secondary | ICD-10-CM | POA: Diagnosis not present

## 2021-10-09 DIAGNOSIS — L814 Other melanin hyperpigmentation: Secondary | ICD-10-CM | POA: Diagnosis not present

## 2021-10-09 DIAGNOSIS — Z79899 Other long term (current) drug therapy: Secondary | ICD-10-CM | POA: Diagnosis not present

## 2021-10-27 ENCOUNTER — Telehealth: Payer: Self-pay | Admitting: Oncology

## 2021-10-27 NOTE — Telephone Encounter (Signed)
Called patient regarding upcoming appointment, left a voicemail. 

## 2021-10-31 DIAGNOSIS — K5730 Diverticulosis of large intestine without perforation or abscess without bleeding: Secondary | ICD-10-CM | POA: Diagnosis not present

## 2021-10-31 DIAGNOSIS — D122 Benign neoplasm of ascending colon: Secondary | ICD-10-CM | POA: Diagnosis not present

## 2021-10-31 DIAGNOSIS — D123 Benign neoplasm of transverse colon: Secondary | ICD-10-CM | POA: Diagnosis not present

## 2021-10-31 DIAGNOSIS — D124 Benign neoplasm of descending colon: Secondary | ICD-10-CM | POA: Diagnosis not present

## 2021-10-31 DIAGNOSIS — K648 Other hemorrhoids: Secondary | ICD-10-CM | POA: Diagnosis not present

## 2021-10-31 DIAGNOSIS — D125 Benign neoplasm of sigmoid colon: Secondary | ICD-10-CM | POA: Diagnosis not present

## 2021-10-31 DIAGNOSIS — D120 Benign neoplasm of cecum: Secondary | ICD-10-CM | POA: Diagnosis not present

## 2021-10-31 DIAGNOSIS — Z86010 Personal history of colonic polyps: Secondary | ICD-10-CM | POA: Diagnosis not present

## 2021-10-31 DIAGNOSIS — K6389 Other specified diseases of intestine: Secondary | ICD-10-CM | POA: Diagnosis not present

## 2021-11-01 ENCOUNTER — Other Ambulatory Visit: Payer: Self-pay | Admitting: Gastroenterology

## 2021-11-01 DIAGNOSIS — R198 Other specified symptoms and signs involving the digestive system and abdomen: Secondary | ICD-10-CM

## 2021-11-03 DIAGNOSIS — D123 Benign neoplasm of transverse colon: Secondary | ICD-10-CM | POA: Diagnosis not present

## 2021-11-03 DIAGNOSIS — D122 Benign neoplasm of ascending colon: Secondary | ICD-10-CM | POA: Diagnosis not present

## 2021-11-03 DIAGNOSIS — D124 Benign neoplasm of descending colon: Secondary | ICD-10-CM | POA: Diagnosis not present

## 2021-11-03 DIAGNOSIS — D125 Benign neoplasm of sigmoid colon: Secondary | ICD-10-CM | POA: Diagnosis not present

## 2021-11-03 DIAGNOSIS — D120 Benign neoplasm of cecum: Secondary | ICD-10-CM | POA: Diagnosis not present

## 2021-11-07 ENCOUNTER — Other Ambulatory Visit: Payer: Medicare HMO

## 2021-11-17 DIAGNOSIS — N400 Enlarged prostate without lower urinary tract symptoms: Secondary | ICD-10-CM | POA: Diagnosis not present

## 2021-11-19 ENCOUNTER — Other Ambulatory Visit: Payer: Self-pay | Admitting: Oncology

## 2021-11-20 ENCOUNTER — Encounter: Payer: Self-pay | Admitting: Oncology

## 2021-11-24 DIAGNOSIS — R972 Elevated prostate specific antigen [PSA]: Secondary | ICD-10-CM | POA: Diagnosis not present

## 2021-11-24 DIAGNOSIS — N400 Enlarged prostate without lower urinary tract symptoms: Secondary | ICD-10-CM | POA: Diagnosis not present

## 2021-11-27 DIAGNOSIS — D751 Secondary polycythemia: Secondary | ICD-10-CM | POA: Diagnosis not present

## 2021-11-27 DIAGNOSIS — L932 Other local lupus erythematosus: Secondary | ICD-10-CM | POA: Diagnosis not present

## 2021-11-27 DIAGNOSIS — Z6824 Body mass index (BMI) 24.0-24.9, adult: Secondary | ICD-10-CM | POA: Diagnosis not present

## 2021-11-27 DIAGNOSIS — M1991 Primary osteoarthritis, unspecified site: Secondary | ICD-10-CM | POA: Diagnosis not present

## 2021-11-27 DIAGNOSIS — M064 Inflammatory polyarthropathy: Secondary | ICD-10-CM | POA: Diagnosis not present

## 2021-11-27 DIAGNOSIS — M353 Polymyalgia rheumatica: Secondary | ICD-10-CM | POA: Diagnosis not present

## 2021-11-30 ENCOUNTER — Inpatient Hospital Stay: Payer: Medicare HMO | Attending: Oncology

## 2021-11-30 ENCOUNTER — Other Ambulatory Visit: Payer: Self-pay

## 2021-11-30 ENCOUNTER — Inpatient Hospital Stay: Payer: Medicare HMO | Admitting: Oncology

## 2021-11-30 VITALS — BP 120/77 | HR 82 | Temp 97.6°F | Resp 16 | Ht 70.0 in | Wt 174.5 lb

## 2021-11-30 DIAGNOSIS — D75839 Thrombocytosis, unspecified: Secondary | ICD-10-CM | POA: Diagnosis not present

## 2021-11-30 DIAGNOSIS — D45 Polycythemia vera: Secondary | ICD-10-CM | POA: Diagnosis not present

## 2021-11-30 DIAGNOSIS — M329 Systemic lupus erythematosus, unspecified: Secondary | ICD-10-CM | POA: Insufficient documentation

## 2021-11-30 DIAGNOSIS — Z79899 Other long term (current) drug therapy: Secondary | ICD-10-CM | POA: Diagnosis not present

## 2021-11-30 DIAGNOSIS — D72829 Elevated white blood cell count, unspecified: Secondary | ICD-10-CM | POA: Insufficient documentation

## 2021-11-30 LAB — CBC WITH DIFFERENTIAL (CANCER CENTER ONLY)
Abs Immature Granulocytes: 0.19 10*3/uL — ABNORMAL HIGH (ref 0.00–0.07)
Basophils Absolute: 0.1 10*3/uL (ref 0.0–0.1)
Basophils Relative: 1 %
Eosinophils Absolute: 0.3 10*3/uL (ref 0.0–0.5)
Eosinophils Relative: 5 %
HCT: 46.4 % (ref 39.0–52.0)
Hemoglobin: 15.3 g/dL (ref 13.0–17.0)
Immature Granulocytes: 3 %
Lymphocytes Relative: 9 %
Lymphs Abs: 0.7 10*3/uL (ref 0.7–4.0)
MCH: 33.3 pg (ref 26.0–34.0)
MCHC: 33 g/dL (ref 30.0–36.0)
MCV: 101.1 fL — ABNORMAL HIGH (ref 80.0–100.0)
Monocytes Absolute: 0.5 10*3/uL (ref 0.1–1.0)
Monocytes Relative: 6 %
Neutro Abs: 5.8 10*3/uL (ref 1.7–7.7)
Neutrophils Relative %: 76 %
Platelet Count: 413 10*3/uL — ABNORMAL HIGH (ref 150–400)
RBC: 4.59 MIL/uL (ref 4.22–5.81)
RDW: 15.8 % — ABNORMAL HIGH (ref 11.5–15.5)
WBC Count: 7.5 10*3/uL (ref 4.0–10.5)
nRBC: 0 % (ref 0.0–0.2)

## 2021-11-30 LAB — CMP (CANCER CENTER ONLY)
ALT: 17 U/L (ref 0–44)
AST: 18 U/L (ref 15–41)
Albumin: 4.4 g/dL (ref 3.5–5.0)
Alkaline Phosphatase: 73 U/L (ref 38–126)
Anion gap: 6 (ref 5–15)
BUN: 18 mg/dL (ref 8–23)
CO2: 27 mmol/L (ref 22–32)
Calcium: 9.2 mg/dL (ref 8.9–10.3)
Chloride: 106 mmol/L (ref 98–111)
Creatinine: 1.17 mg/dL (ref 0.61–1.24)
GFR, Estimated: >60 mL/min (ref >60)
Glucose, Bld: 195 mg/dL — ABNORMAL HIGH (ref 70–99)
Potassium: 4.2 mmol/L (ref 3.5–5.1)
Sodium: 139 mmol/L (ref 135–145)
Total Bilirubin: 0.7 mg/dL (ref 0.3–1.2)
Total Protein: 7.1 g/dL (ref 6.5–8.1)

## 2021-11-30 NOTE — Progress Notes (Signed)
Hematology and Oncology Follow Up Visit  Joshua Ryan [id number] 1945/08/07 76 y.o. 11/30/2021 9:38 AM [name], MDRoss, [name], MD   Principle Diagnosis: 76 year old man with polycythemia vera diagnosed in 2018.  He was found to have JAK2 positive disorder.   Prior therapy:   Phlebotomy on a monthly basis to keep his hemoglobin below 45.  His last phlebotomy given in November 2019.  Hydroxyurea 500 mg daily started on 03/01/2017.  His dose was increased to 1000 mg in September 2019.    Current therapy: Hydroxyurea 500 mg daily.  Interim History: Joshua Ryan is here for return evaluation.  Since last visit, he reports no major changes in his health.  He is tolerating hydroxyurea without any complaints.  He denies any nausea, vomiting or abdominal pain.  He denies any thrombosis or bleeding issues.  His performance status quality of life remains excellent.  He was started on Plaquenil for arthritis by his rheumatologist.     Medications: Updated on review. Current Outpatient Medications  Medication Sig Dispense Refill   aspirin 81 MG tablet Take 81 mg by mouth daily.     Calcium-Magnesium-Vitamin D (CALCIUM 500 PO) Take 1 tablet by mouth 2 (two) times daily.     cetirizine (ZYRTEC) 10 MG tablet Take 10 mg by mouth daily.     cholecalciferol (VITAMIN D) 1000 UNITS tablet Take 2,000 Units by mouth daily.     famotidine (PEPCID) 20 MG tablet Take 20 mg by mouth 2 (two) times daily.     hydroxyurea (HYDREA) 500 MG capsule TAKE 1 CAPSULE BY MOUTH ONCE DAILY MAY  TAKE  WITH  FOOD  TO  MINIMIZE  GI  SIDE  EFFECTS 90 capsule 0   irbesartan (AVAPRO) 300 MG tablet Take 300 mg by mouth daily.  3   KRILL OIL PO Take 500 mg by mouth daily.     predniSONE (DELTASONE) 1 MG tablet Take 3 mg by mouth daily.  2   Saw Palmetto, Serenoa repens, (SAW PALMETTO PO) Take 450 mg by mouth daily.     No current facility-administered medications for this visit.     Allergies:  No Known Allergies    Physical Exam:     Blood pressure 120/77, pulse 82, temperature 97.6 F (36.4 C), temperature source Temporal, resp. rate 16, height '5\' 10"'$  (1.778 m), weight 174 lb 8 oz (79.2 kg), SpO2 98 %.      ECOG: 0    General appearance: Comfortable appearing without any discomfort Head: Normocephalic without any trauma Oropharynx: Mucous membranes are moist and pink without any thrush or ulcers. Eyes: Pupils are equal and round reactive to light. Lymph nodes: No cervical, supraclavicular, inguinal or axillary lymphadenopathy.   Heart:regular rate and rhythm.  S1 and S2 without leg edema. Lung: Clear without any rhonchi or wheezes.  No dullness to percussion. Abdomin: Soft, nontender, nondistended with good bowel sounds.  No hepatosplenomegaly. Musculoskeletal: No joint deformity or effusion.  Full range of motion noted. Neurological: No deficits noted on motor, sensory and deep tendon reflex exam. Skin: No petechial rash or dryness.  Appeared moist.           Lab Results: Lab Results  Component Value Date   WBC 5.6 06/01/2021   HGB 14.7 06/01/2021   HCT 42.4 06/01/2021   MCV 108.2 (H) 06/01/2021   PLT 322 06/01/2021     Chemistry      Component Value Date/Time   NA 140 06/01/2021 1016  NA 139 06/28/2017 1451   K 5.1 06/01/2021 1016   K 5.5 No visable hemolysis (H) 06/28/2017 1451   CL 106 06/01/2021 1016   CO2 25 06/01/2021 1016   CO2 24 06/28/2017 1451   BUN 14 06/01/2021 1016   BUN 24.0 06/28/2017 1451   CREATININE 1.15 06/01/2021 1016   CREATININE 1.2 06/28/2017 1451      Component Value Date/Time   CALCIUM 9.6 06/01/2021 1016   CALCIUM 9.4 06/28/2017 1451   ALKPHOS 61 06/01/2021 1016   ALKPHOS 74 06/28/2017 1451   AST 26 06/01/2021 1016   AST 19 06/28/2017 1451   ALT 25 06/01/2021 1016   ALT 19 06/28/2017 1451   BILITOT 1.1 06/01/2021 1016   BILITOT 1.01 06/28/2017 1451        Impression and Plan:  76 year old man  with:  1.  JAK2 positive polycythemia vera diagnosed in 2018.    His disease status was updated at this time and treatment choices were reviewed.  Risks and benefits of continuing hydroxyurea were discussed.  Complications including myelosuppression, GI toxicity as well as bone marrow failure among others were reiterated.  CBC from today showed adequate control of his hematological parameters with hemoglobin of 15 hematocrit of 46.    2. Leukocytosis and thrombocytosis: Controlled at this time with the current dose of hydroxyurea.  No adjustment is needed.   3. Thrombosis prophylaxis: His thrombosis risk is low at this time.  4.  Lupus arthritis: He is currently on Plaquenil we will continue to monitor his counts with hydroxyurea.  Counts are adequate at this time.  5. Follow-up: In 6 months for repeat follow-up.  30  minutes were dedicated to this encounter.  The time was spent on reviewing laboratory data, disease status update, treatment choices and outlining future plan of care.       Zola Button, MD 5/18/20239:38 AM

## 2021-12-14 DIAGNOSIS — H524 Presbyopia: Secondary | ICD-10-CM | POA: Diagnosis not present

## 2021-12-14 DIAGNOSIS — H25813 Combined forms of age-related cataract, bilateral: Secondary | ICD-10-CM | POA: Diagnosis not present

## 2022-02-17 ENCOUNTER — Other Ambulatory Visit: Payer: Self-pay | Admitting: Oncology

## 2022-02-19 ENCOUNTER — Encounter: Payer: Self-pay | Admitting: Oncology

## 2022-02-21 ENCOUNTER — Telehealth: Payer: Self-pay | Admitting: Oncology

## 2022-02-21 NOTE — Telephone Encounter (Signed)
Called patient regarding upcoming November appointment, left a voicemail. 

## 2022-02-26 DIAGNOSIS — Z6824 Body mass index (BMI) 24.0-24.9, adult: Secondary | ICD-10-CM | POA: Diagnosis not present

## 2022-02-26 DIAGNOSIS — D751 Secondary polycythemia: Secondary | ICD-10-CM | POA: Diagnosis not present

## 2022-02-26 DIAGNOSIS — M1991 Primary osteoarthritis, unspecified site: Secondary | ICD-10-CM | POA: Diagnosis not present

## 2022-02-26 DIAGNOSIS — Z79899 Other long term (current) drug therapy: Secondary | ICD-10-CM | POA: Diagnosis not present

## 2022-02-26 DIAGNOSIS — M353 Polymyalgia rheumatica: Secondary | ICD-10-CM | POA: Diagnosis not present

## 2022-02-26 DIAGNOSIS — L932 Other local lupus erythematosus: Secondary | ICD-10-CM | POA: Diagnosis not present

## 2022-02-26 DIAGNOSIS — M064 Inflammatory polyarthropathy: Secondary | ICD-10-CM | POA: Diagnosis not present

## 2022-02-26 DIAGNOSIS — Z7952 Long term (current) use of systemic steroids: Secondary | ICD-10-CM | POA: Diagnosis not present

## 2022-03-26 DIAGNOSIS — E782 Mixed hyperlipidemia: Secondary | ICD-10-CM | POA: Diagnosis not present

## 2022-03-26 DIAGNOSIS — I10 Essential (primary) hypertension: Secondary | ICD-10-CM | POA: Diagnosis not present

## 2022-03-26 DIAGNOSIS — Z23 Encounter for immunization: Secondary | ICD-10-CM | POA: Diagnosis not present

## 2022-03-26 DIAGNOSIS — R7301 Impaired fasting glucose: Secondary | ICD-10-CM | POA: Diagnosis not present

## 2022-03-26 DIAGNOSIS — Z0000 Encounter for general adult medical examination without abnormal findings: Secondary | ICD-10-CM | POA: Diagnosis not present

## 2022-03-26 DIAGNOSIS — K219 Gastro-esophageal reflux disease without esophagitis: Secondary | ICD-10-CM | POA: Diagnosis not present

## 2022-04-11 DIAGNOSIS — M329 Systemic lupus erythematosus, unspecified: Secondary | ICD-10-CM | POA: Diagnosis not present

## 2022-04-16 ENCOUNTER — Other Ambulatory Visit: Payer: Self-pay | Admitting: Gastroenterology

## 2022-04-16 DIAGNOSIS — R6889 Other general symptoms and signs: Secondary | ICD-10-CM | POA: Diagnosis not present

## 2022-04-16 DIAGNOSIS — Z86010 Personal history of colonic polyps: Secondary | ICD-10-CM

## 2022-04-17 ENCOUNTER — Other Ambulatory Visit: Payer: Medicare HMO

## 2022-04-20 ENCOUNTER — Other Ambulatory Visit: Payer: Medicare HMO

## 2022-05-01 ENCOUNTER — Other Ambulatory Visit: Payer: Self-pay | Admitting: Gastroenterology

## 2022-05-01 DIAGNOSIS — R933 Abnormal findings on diagnostic imaging of other parts of digestive tract: Secondary | ICD-10-CM

## 2022-05-08 ENCOUNTER — Ambulatory Visit
Admission: RE | Admit: 2022-05-08 | Discharge: 2022-05-08 | Disposition: A | Discharge status: Home / Self Care | Payer: Medicare HMO | Source: Ambulatory Visit | Attending: Gastroenterology | Admitting: Gastroenterology

## 2022-05-08 ENCOUNTER — Other Ambulatory Visit: Payer: Self-pay | Admitting: Gastroenterology

## 2022-05-08 DIAGNOSIS — R933 Abnormal findings on diagnostic imaging of other parts of digestive tract: Secondary | ICD-10-CM

## 2022-05-08 DIAGNOSIS — K8020 Calculus of gallbladder without cholecystitis without obstruction: Secondary | ICD-10-CM | POA: Diagnosis not present

## 2022-05-13 ENCOUNTER — Other Ambulatory Visit: Payer: Self-pay | Admitting: Oncology

## 2022-05-14 ENCOUNTER — Encounter: Payer: Self-pay | Admitting: Oncology

## 2022-05-31 ENCOUNTER — Inpatient Hospital Stay: Payer: Medicare HMO | Attending: Oncology

## 2022-05-31 ENCOUNTER — Inpatient Hospital Stay (HOSPITAL_BASED_OUTPATIENT_CLINIC_OR_DEPARTMENT_OTHER): Payer: Medicare HMO | Admitting: Oncology

## 2022-05-31 ENCOUNTER — Other Ambulatory Visit: Payer: Self-pay

## 2022-05-31 VITALS — BP 127/76 | HR 83 | Temp 98.0°F | Resp 16 | Wt 175.7 lb

## 2022-05-31 DIAGNOSIS — D45 Polycythemia vera: Secondary | ICD-10-CM | POA: Diagnosis not present

## 2022-05-31 DIAGNOSIS — D75839 Thrombocytosis, unspecified: Secondary | ICD-10-CM | POA: Insufficient documentation

## 2022-05-31 DIAGNOSIS — Z79899 Other long term (current) drug therapy: Secondary | ICD-10-CM | POA: Insufficient documentation

## 2022-05-31 DIAGNOSIS — M3214 Glomerular disease in systemic lupus erythematosus: Secondary | ICD-10-CM | POA: Insufficient documentation

## 2022-05-31 DIAGNOSIS — D72829 Elevated white blood cell count, unspecified: Secondary | ICD-10-CM | POA: Insufficient documentation

## 2022-05-31 LAB — CMP (CANCER CENTER ONLY)
ALT: 22 U/L (ref 0–44)
AST: 23 U/L (ref 15–41)
Albumin: 4.7 g/dL (ref 3.5–5.0)
Alkaline Phosphatase: 85 U/L (ref 38–126)
Anion gap: 7 (ref 5–15)
BUN: 18 mg/dL (ref 8–23)
CO2: 27 mmol/L (ref 22–32)
Calcium: 9.9 mg/dL (ref 8.9–10.3)
Chloride: 104 mmol/L (ref 98–111)
Creatinine: 1.23 mg/dL (ref 0.61–1.24)
GFR, Estimated: >60 mL/min (ref >60)
Glucose, Bld: 84 mg/dL (ref 70–99)
Potassium: 5.2 mmol/L — ABNORMAL HIGH (ref 3.5–5.1)
Sodium: 138 mmol/L (ref 135–145)
Total Bilirubin: 1.1 mg/dL (ref 0.3–1.2)
Total Protein: 7.6 g/dL (ref 6.5–8.1)

## 2022-05-31 LAB — CBC WITH DIFFERENTIAL (CANCER CENTER ONLY)
Abs Immature Granulocytes: 0.1 10*3/uL — ABNORMAL HIGH (ref 0.00–0.07)
Basophils Absolute: 0.1 10*3/uL (ref 0.0–0.1)
Basophils Relative: 1 %
Eosinophils Absolute: 0.2 10*3/uL (ref 0.0–0.5)
Eosinophils Relative: 2 %
HCT: 51.8 % (ref 39.0–52.0)
Hemoglobin: 17.6 g/dL — ABNORMAL HIGH (ref 13.0–17.0)
Immature Granulocytes: 1 %
Lymphocytes Relative: 9 %
Lymphs Abs: 0.8 10*3/uL (ref 0.7–4.0)
MCH: 35.8 pg — ABNORMAL HIGH (ref 26.0–34.0)
MCHC: 34 g/dL (ref 30.0–36.0)
MCV: 105.3 fL — ABNORMAL HIGH (ref 80.0–100.0)
Monocytes Absolute: 0.9 10*3/uL (ref 0.1–1.0)
Monocytes Relative: 9 %
Neutro Abs: 7.1 10*3/uL (ref 1.7–7.7)
Neutrophils Relative %: 78 %
Platelet Count: 305 10*3/uL (ref 150–400)
RBC: 4.92 MIL/uL (ref 4.22–5.81)
RDW: 17.7 % — ABNORMAL HIGH (ref 11.5–15.5)
WBC Count: 9.2 10*3/uL (ref 4.0–10.5)
nRBC: 0 % (ref 0.0–0.2)

## 2022-05-31 NOTE — Progress Notes (Signed)
Hematology and Oncology Follow Up Visit  Joshua Ryan [id number] 27-Oct-1945 76 y.o. 05/31/2022 3:02 PM Joshua Ryan, MDRoss, Joshua Luo, MD   Principle Diagnosis: 76 year old man with JAK2 positive polycythemia vera diagnosed in 2018.     Prior therapy:   Phlebotomy on a monthly basis to keep his hemoglobin below 45.  His last phlebotomy given in November 2019.  Hydroxyurea 500 mg daily started on 03/01/2017.  His dose was increased to 1000 mg in September 2019 and subsequently reduced to 500 mg daily.   Current therapy: Hydroxyurea 500 mg daily.   Interim History: Joshua Ryan returns today for a follow-up.  Since last visit, he reports no major changes in his health.  He denies any complications related to hydroxyurea.  He denies any nausea, vomiting or abdominal pain.  He denies any hospitalizations or illnesses.  He denies any chest pain or shortness of breath.  He denies any arthralgias or myalgias.  He is also on Plaquenil for lupus nephritis without any issues.     Medications: Reviewed without changes. Current Outpatient Medications  Medication Sig Dispense Refill   aspirin 81 MG tablet Take 81 mg by mouth daily.     Calcium-Magnesium-Vitamin D (CALCIUM 500 PO) Take 1 tablet by mouth 2 (two) times daily.     cetirizine (ZYRTEC) 10 MG tablet Take 10 mg by mouth daily.     cholecalciferol (VITAMIN D) 1000 UNITS tablet Take 2,000 Units by mouth daily.     famotidine (PEPCID) 20 MG tablet Take 20 mg by mouth 2 (two) times daily.     hydroxychloroquine (PLAQUENIL) 200 MG tablet Take 200 mg by mouth 2 (two) times daily.     hydroxyurea (HYDREA) 500 MG capsule TAKE 1 CAPSULE BY MOUTH ONCE DAILY WITH  FOOD  TO  MINIMIZE  GI  SIDE  EFFECTS 90 capsule 0   irbesartan (AVAPRO) 300 MG tablet Take 300 mg by mouth daily.  3   KRILL OIL PO Take 500 mg by mouth daily.     predniSONE (DELTASONE) 1 MG tablet Take 3 mg by mouth daily.  2   Saw Palmetto, Serenoa repens, (SAW  PALMETTO PO) Take 450 mg by mouth daily.     No current facility-administered medications for this visit.     Allergies: No Known Allergies    Physical Exam:      Blood pressure 127/76, pulse 83, temperature 98 F (36.7 C), temperature source Oral, resp. rate 16, weight 175 lb 11.2 oz (79.7 kg), SpO2 97 %.      ECOG: 0    General appearance: Comfortable appearing without any discomfort Head: Normocephalic without any trauma Oropharynx: Mucous membranes are moist and pink without any thrush or ulcers. Eyes: Pupils are equal and round reactive to light. Lymph nodes: No cervical, supraclavicular, inguinal or axillary lymphadenopathy.   Heart:regular rate and rhythm.  S1 and S2 without leg edema. Lung: Clear without any rhonchi or wheezes.  No dullness to percussion. Abdomin: Soft, nontender, nondistended with good bowel sounds.  No hepatosplenomegaly. Musculoskeletal: No joint deformity or effusion.  Full range of motion noted. Neurological: No deficits noted on motor, sensory and deep tendon reflex exam. Skin: No petechial rash or dryness.  Appeared moist.           Lab Results: Lab Results  Component Value Date   WBC 7.5 11/30/2021   HGB 15.3 11/30/2021   HCT 46.4 11/30/2021   MCV 101.1 (H) 11/30/2021   PLT 413 (H) 11/30/2021  Chemistry      Component Value Date/Time   NA 139 11/30/2021 0929   NA 139 06/28/2017 1451   K 4.2 11/30/2021 0929   K 5.5 No visable hemolysis (H) 06/28/2017 1451   CL 106 11/30/2021 0929   CO2 27 11/30/2021 0929   CO2 24 06/28/2017 1451   BUN 18 11/30/2021 0929   BUN 24.0 06/28/2017 1451   CREATININE 1.17 11/30/2021 0929   CREATININE 1.2 06/28/2017 1451      Component Value Date/Time   CALCIUM 9.2 11/30/2021 0929   CALCIUM 9.4 06/28/2017 1451   ALKPHOS 73 11/30/2021 0929   ALKPHOS 74 06/28/2017 1451   AST 18 11/30/2021 0929   AST 19 06/28/2017 1451   ALT 17 11/30/2021 0929   ALT 19 06/28/2017 1451   BILITOT 0.7  11/30/2021 0929   BILITOT 1.01 06/28/2017 1451        Impression and Plan:  76 year old man with:  1.  Polycythemia vera diagnosed in 2018.  He was found to have JAK2 positive mutation.   He continues to be on hydroxyurea with excellent control of his counts without any need for additional phlebotomy.  Risks and benefits of continuing this treatment were reviewed at this time.  Complications including pruritus, mucositis and bone marrow disease were reiterated.  His hemoglobin is mildly elevated today but does not require any adjustment at this time.  I recommended the same dose and schedule of hydroxyurea.   2. Leukocytosis and thrombocytosis: Currently within normal range.  No need for adjustment of the hydroxyurea dosing.   3. Thrombosis prophylaxis: His risk of thrombosis remains low at this time.  His white cell count under reasonable control.  4.  Lupus arthritis: He is currently on Plaquenil without any complications.  5. Follow-up: He will return in 6 months for a follow-up.  30  minutes were spent on this visit.  The time was dedicated to reviewing laboratory data, disease status update and outlining future plan of care review.       Zola Button, MD 11/16/20233:02 PM

## 2022-08-06 ENCOUNTER — Other Ambulatory Visit: Payer: Self-pay | Admitting: Oncology

## 2022-08-06 DIAGNOSIS — D45 Polycythemia vera: Secondary | ICD-10-CM

## 2022-08-20 DIAGNOSIS — M064 Inflammatory polyarthropathy: Secondary | ICD-10-CM | POA: Diagnosis not present

## 2022-08-20 DIAGNOSIS — Z79899 Other long term (current) drug therapy: Secondary | ICD-10-CM | POA: Diagnosis not present

## 2022-08-20 DIAGNOSIS — E663 Overweight: Secondary | ICD-10-CM | POA: Diagnosis not present

## 2022-08-20 DIAGNOSIS — M353 Polymyalgia rheumatica: Secondary | ICD-10-CM | POA: Diagnosis not present

## 2022-08-20 DIAGNOSIS — Z6825 Body mass index (BMI) 25.0-25.9, adult: Secondary | ICD-10-CM | POA: Diagnosis not present

## 2022-08-20 DIAGNOSIS — M1991 Primary osteoarthritis, unspecified site: Secondary | ICD-10-CM | POA: Diagnosis not present

## 2022-08-20 DIAGNOSIS — L932 Other local lupus erythematosus: Secondary | ICD-10-CM | POA: Diagnosis not present

## 2022-08-29 DIAGNOSIS — R933 Abnormal findings on diagnostic imaging of other parts of digestive tract: Secondary | ICD-10-CM | POA: Diagnosis not present

## 2022-11-20 ENCOUNTER — Other Ambulatory Visit: Payer: Self-pay | Admitting: *Deleted

## 2022-11-20 DIAGNOSIS — D45 Polycythemia vera: Secondary | ICD-10-CM

## 2022-11-20 NOTE — Telephone Encounter (Signed)
Next appointment 12/26/22 Transfer from Dr Clelia Croft           Component Ref Range & Units 5 mo ago (05/31/22) 11 mo ago (11/30/21) 1 yr ago (06/01/21) 1 yr ago (12/01/20) 2 yr ago (06/02/20) 2 yr ago (02/18/20) 3 yr ago (04/03/19)  WBC Count 4.0 - 10.5 K/uL 9.2 7.5 5.6 9.3 5.2 3.2 Low  4.4  RBC 4.22 - 5.81 MIL/uL 4.92 4.59 3.92 Low  4.68 3.41 Low  3.40 Low  3.48 Low   Hemoglobin 13.0 - 17.0 g/dL 16.1 High  09.6 04.5 40.9 12.7 Low  12.8 Low  13.7  HCT 39.0 - 52.0 % 51.8 46.4 42.4 48.4 37.3 Low  36.3 Low  39.3  MCV 80.0 - 100.0 fL 105.3 High  101.1 High  108.2 High  103.4 High  109.4 High  106.8 High  112.9 High   MCH 26.0 - 34.0 pg 35.8 High  33.3 37.5 High  35.9 High  37.2 High  37.6 High  39.4 High   MCHC 30.0 - 36.0 g/dL 81.1 91.4 78.2 95.6 21.3 35.3 34.9  RDW 11.5 - 15.5 % 17.7 High  15.8 High  13.9 14.6 12.4 13.7 13.2  Platelet Count 150 - 400 K/uL 305 413 High  322 380 241 153 250  nRBC 0.0 - 0.2 % 0.0 0.0 0.0 0.0 0.0 0.0 0.0  Neutrophils Relative % % 78 76 75 79 69 55 65  Neutro Abs 1.7 - 7.7 K/uL 7.1 5.8 4.2 7.5 3.6 1.8 2.9  Lymphocytes Relative % 9 9 12 9 17 27 21   Lymphs Abs 0.7 - 4.0 K/uL 0.8 0.7 0.7 0.8 0.9 0.8 0.9  Monocytes Relative % 9 6 7 7 10 16 10   Monocytes Absolute 0.1 - 1.0 K/uL 0.9 0.5 0.4 0.6 0.5 0.5 0.4  Eosinophils Relative % 2 5 3 3 2 1 2   Eosinophils Absolute 0.0 - 0.5 K/uL 0.2 0.3 0.2 0.3 0.1 0.0 0.1  Basophils Relative % 1 1 1 1 1 1 1   Basophils Absolute 0.0 - 0.1 K/uL 0.1 0.1 0.1 0.1 0.0 0.0 0.0  Immature Granulocytes % 1 3 2 1 1  0 1  Abs Immature Granulocytes 0.00 - 0.07 K/uL 0.10 High  0.19 High  CM 0.10 High  CM 0.11 High  CM 0.05 CM 0.01 CM 0.02 CM  Comment: Performed at [facility], [address], [place], [place] [address]  Resulting Agency [facility] CLIN LAB [facility] LAB [facility] LAB         Specimen Collected: 05/31/22 15:07 Last Resulted: 05/31/22 15:23

## 2022-11-21 ENCOUNTER — Encounter: Payer: Self-pay | Admitting: Oncology

## 2022-11-21 MED ORDER — HYDROXYUREA 500 MG PO CAPS: ORAL_CAPSULE | ORAL | 0 refills | Status: DC

## 2022-11-23 DIAGNOSIS — N400 Enlarged prostate without lower urinary tract symptoms: Secondary | ICD-10-CM | POA: Diagnosis not present

## 2022-11-29 ENCOUNTER — Other Ambulatory Visit: Payer: Self-pay | Admitting: *Deleted

## 2022-11-29 DIAGNOSIS — D45 Polycythemia vera: Secondary | ICD-10-CM

## 2022-11-29 MED ORDER — HYDROXYUREA 500 MG PO CAPS: ORAL_CAPSULE | ORAL | 0 refills | Status: DC

## 2022-11-30 DIAGNOSIS — N401 Enlarged prostate with lower urinary tract symptoms: Secondary | ICD-10-CM | POA: Diagnosis not present

## 2022-11-30 DIAGNOSIS — R3912 Poor urinary stream: Secondary | ICD-10-CM | POA: Diagnosis not present

## 2022-12-24 ENCOUNTER — Other Ambulatory Visit: Payer: Self-pay | Admitting: *Deleted

## 2022-12-24 DIAGNOSIS — D45 Polycythemia vera: Secondary | ICD-10-CM

## 2022-12-26 ENCOUNTER — Inpatient Hospital Stay: Payer: Medicare HMO | Admitting: Medical Oncology

## 2022-12-26 ENCOUNTER — Encounter: Payer: Self-pay | Admitting: Medical Oncology

## 2022-12-26 ENCOUNTER — Inpatient Hospital Stay: Payer: Medicare HMO | Attending: Oncology

## 2022-12-26 VITALS — BP 136/81 | HR 81 | Temp 98.7°F | Wt 175.4 lb

## 2022-12-26 DIAGNOSIS — D45 Polycythemia vera: Secondary | ICD-10-CM | POA: Diagnosis not present

## 2022-12-26 DIAGNOSIS — M3214 Glomerular disease in systemic lupus erythematosus: Secondary | ICD-10-CM | POA: Diagnosis not present

## 2022-12-26 DIAGNOSIS — D72829 Elevated white blood cell count, unspecified: Secondary | ICD-10-CM | POA: Insufficient documentation

## 2022-12-26 DIAGNOSIS — D75839 Thrombocytosis, unspecified: Secondary | ICD-10-CM | POA: Insufficient documentation

## 2022-12-26 LAB — CMP (CANCER CENTER ONLY)
ALT: 15 U/L (ref 0–44)
AST: 20 U/L (ref 15–41)
Albumin: 4.1 g/dL (ref 3.5–5.0)
Alkaline Phosphatase: 54 U/L (ref 38–126)
Anion gap: 12 (ref 5–15)
BUN: 16 mg/dL (ref 8–23)
CO2: 21 mmol/L — ABNORMAL LOW (ref 22–32)
Calcium: 9.1 mg/dL (ref 8.9–10.3)
Chloride: 107 mmol/L (ref 98–111)
Creatinine: 1.07 mg/dL (ref 0.61–1.24)
GFR, Estimated: >60 mL/min
Glucose, Bld: 77 mg/dL (ref 70–99)
Potassium: 5.2 mmol/L — ABNORMAL HIGH (ref 3.5–5.1)
Sodium: 140 mmol/L (ref 135–145)
Total Bilirubin: 0.7 mg/dL (ref 0.3–1.2)
Total Protein: 6.7 g/dL (ref 6.5–8.1)

## 2022-12-26 LAB — CBC WITH DIFFERENTIAL (CANCER CENTER ONLY)
Abs Immature Granulocytes: 0.18 10*3/uL — ABNORMAL HIGH (ref 0.00–0.07)
Basophils Absolute: 0.1 10*3/uL (ref 0.0–0.1)
Basophils Relative: 1 %
Eosinophils Absolute: 0.2 10*3/uL (ref 0.0–0.5)
Eosinophils Relative: 2 %
HCT: 45.3 % (ref 39.0–52.0)
Hemoglobin: 15.1 g/dL (ref 13.0–17.0)
Immature Granulocytes: 2 %
Lymphocytes Relative: 9 %
Lymphs Abs: 0.9 10*3/uL (ref 0.7–4.0)
MCH: 36.4 pg — ABNORMAL HIGH (ref 26.0–34.0)
MCHC: 33.3 g/dL (ref 30.0–36.0)
MCV: 109.2 fL — ABNORMAL HIGH (ref 80.0–100.0)
Monocytes Absolute: 0.7 10*3/uL (ref 0.1–1.0)
Monocytes Relative: 7 %
Neutro Abs: 7.6 10*3/uL (ref 1.7–7.7)
Neutrophils Relative %: 79 %
Platelet Count: 360 10*3/uL (ref 150–400)
RBC: 4.15 MIL/uL — ABNORMAL LOW (ref 4.22–5.81)
RDW: 13.9 % (ref 11.5–15.5)
WBC Count: 9.6 10*3/uL (ref 4.0–10.5)
nRBC: 0 % (ref 0.0–0.2)

## 2022-12-26 NOTE — Progress Notes (Signed)
Hematology and Oncology Follow Up Visit  Joshua Ryan [id number] 05-04-1946 77 y.o. 12/26/2022 4:07 PM Joshua Ryan, MDRoss, Joshua Round, MD   Principle Diagnosis: 77 year old man with JAK2 positive polycythemia vera diagnosed in 2018.    Phlebotomy on a monthly basis to keep his Hematocrit below 45.  His last phlebotomy given in November 2019.  Hydroxyurea 500 mg daily started on 03/01/2017.  His dose was increased to 1000 mg in September 2019 and subsequently reduced to 500 mg daily.   Current therapy: Hydroxyurea 500 mg daily.   Interim History: Joshua Ryan returns today for a follow-up. He is here with his wife.   Since last visit, he reports no major changes in his health.  He is tolerating his hydroxyurea well without any side effects.  He denies any nausea, vomiting or abdominal pain.  He denies any hospitalizations or illnesses.  He denies any chest pain or shortness of breath.  He denies any arthralgias or myalgias.  He is also on Plaquenil for lupus nephritis without any issues.  Medications: Reviewed without changes. Current Outpatient Medications  Medication Sig Dispense Refill   aspirin 81 MG tablet Take 81 mg by mouth daily.     Calcium-Magnesium-Vitamin D (CALCIUM 500 PO) Take 1 tablet by mouth 2 (two) times daily.     cetirizine (ZYRTEC) 10 MG tablet Take 10 mg by mouth daily.     cholecalciferol (VITAMIN D) 1000 UNITS tablet Take 2,000 Units by mouth daily.     famotidine (PEPCID) 20 MG tablet Take 20 mg by mouth 2 (two) times daily.     hydroxychloroquine (PLAQUENIL) 200 MG tablet Take 200 mg by mouth 2 (two) times daily.     hydroxyurea (HYDREA) 500 MG capsule May take with food to minimize GI side effects. 90 capsule 0   irbesartan (AVAPRO) 300 MG tablet Take 300 mg by mouth daily.  3   KRILL OIL PO Take 500 mg by mouth daily.     predniSONE (DELTASONE) 1 MG tablet Take 3 mg by mouth daily.  2   Saw Palmetto, Serenoa repens, (SAW PALMETTO PO) Take  450 mg by mouth daily.     No current facility-administered medications for this visit.     Allergies: No Known Allergies  Blood pressure 136/81, pulse 81, temperature 98.7 F (37.1 C), temperature source Tympanic, weight 175 lb 6.4 oz (79.6 kg), SpO2 99 %. Physical Exam Vitals and nursing note reviewed.  Constitutional:      General: He is not in acute distress.    Appearance: Normal appearance. He is not ill-appearing, toxic-appearing or diaphoretic.  HENT:     Head: Normocephalic and atraumatic.  Cardiovascular:     Rate and Rhythm: Normal rate and regular rhythm.     Pulses: Normal pulses.     Heart sounds: Normal heart sounds.  Pulmonary:     Effort: Pulmonary effort is normal.     Breath sounds: Normal breath sounds.  Abdominal:     Palpations: Abdomen is soft.  Musculoskeletal:        General: Normal range of motion.     Cervical back: Neck supple.  Skin:    General: Skin is warm.     Findings: No rash.  Neurological:     Mental Status: He is alert and oriented to person, place, and time.    Lab Results: Lab Results  Component Value Date   WBC 9.6 12/26/2022   HGB 15.1 12/26/2022   HCT 45.3 12/26/2022  MCV 109.2 (H) 12/26/2022   PLT 360 12/26/2022     Chemistry      Component Value Date/Time   NA 140 12/26/2022 1319   NA 139 06/28/2017 1451   K 5.2 (H) 12/26/2022 1319   K 5.5 No visable hemolysis (H) 06/28/2017 1451   CL 107 12/26/2022 1319   CO2 21 (L) 12/26/2022 1319   CO2 24 06/28/2017 1451   BUN 16 12/26/2022 1319   BUN 24.0 06/28/2017 1451   CREATININE 1.07 12/26/2022 1319   CREATININE 1.2 06/28/2017 1451      Component Value Date/Time   CALCIUM 9.1 12/26/2022 1319   CALCIUM 9.4 06/28/2017 1451   ALKPHOS 54 12/26/2022 1319   ALKPHOS 74 06/28/2017 1451   AST 20 12/26/2022 1319   AST 19 06/28/2017 1451   ALT 15 12/26/2022 1319   ALT 19 06/28/2017 1451   BILITOT 0.7 12/26/2022 1319   BILITOT 1.01 06/28/2017 1451     Impression and  Plan:  77 year old man with:  1.  Polycythemia vera diagnosed in 2018.  He was found to have JAK2 positive mutation.   He continues to be on hydroxyurea with excellent control of his counts without any need for additional phlebotomy.  Today his Hct is 45.3 He elected to hold off of phlebotomy at this time. Given his blood work trends,  I have recommended the same dose and schedule of hydroxyurea that he has been taking. RTC 6 months.    2. Leukocytosis and thrombocytosis: Currently within normal range.  No need for adjustment of the hydroxyurea dosing.   3. Thrombosis prophylaxis: His risk of thrombosis continues to be low at this time.  NO changes to plan indicated.   4.  Lupus arthritis: He is currently on Plaquenil without any complications. Continue follow up with rheumatology.   Disposition No phlebotomy today RTC 6 months APP, labs ( CBC w, Iron, ferritin, CMP)+- phlebotomy -Joshua Ryan   Joshua Chestnut, PA-C 6/12/20244:07 PM

## 2023-01-23 DIAGNOSIS — M329 Systemic lupus erythematosus, unspecified: Secondary | ICD-10-CM | POA: Diagnosis not present

## 2023-01-23 DIAGNOSIS — L570 Actinic keratosis: Secondary | ICD-10-CM | POA: Diagnosis not present

## 2023-02-18 DIAGNOSIS — M064 Inflammatory polyarthropathy: Secondary | ICD-10-CM | POA: Diagnosis not present

## 2023-02-18 DIAGNOSIS — L932 Other local lupus erythematosus: Secondary | ICD-10-CM | POA: Diagnosis not present

## 2023-02-18 DIAGNOSIS — Z6824 Body mass index (BMI) 24.0-24.9, adult: Secondary | ICD-10-CM | POA: Diagnosis not present

## 2023-02-18 DIAGNOSIS — Z79899 Other long term (current) drug therapy: Secondary | ICD-10-CM | POA: Diagnosis not present

## 2023-02-18 DIAGNOSIS — M353 Polymyalgia rheumatica: Secondary | ICD-10-CM | POA: Diagnosis not present

## 2023-02-18 DIAGNOSIS — M1991 Primary osteoarthritis, unspecified site: Secondary | ICD-10-CM | POA: Diagnosis not present

## 2023-02-22 ENCOUNTER — Inpatient Hospital Stay: Payer: Medicare HMO | Admitting: Medical Oncology

## 2023-02-22 DIAGNOSIS — Z91199 Patient's noncompliance with other medical treatment and regimen due to unspecified reason: Secondary | ICD-10-CM

## 2023-02-22 NOTE — Progress Notes (Signed)
Attempted to reach patient for our 1pm virtual visit today to discuss medication questions. Unable to reach patient at all available numbers. I would recommend that he reschedule or send a Mychart message.

## 2023-02-28 ENCOUNTER — Other Ambulatory Visit: Payer: Self-pay | Admitting: Medical Oncology

## 2023-02-28 ENCOUNTER — Other Ambulatory Visit: Payer: Self-pay | Admitting: *Deleted

## 2023-02-28 DIAGNOSIS — D45 Polycythemia vera: Secondary | ICD-10-CM

## 2023-02-28 MED ORDER — HYDROXYUREA 500 MG PO CAPS: ORAL_CAPSULE | ORAL | 0 refills | Status: DC

## 2023-03-01 ENCOUNTER — Inpatient Hospital Stay: Payer: Medicare HMO | Attending: Oncology | Admitting: Medical Oncology

## 2023-03-01 DIAGNOSIS — D45 Polycythemia vera: Secondary | ICD-10-CM

## 2023-03-01 MED ORDER — HYDROXYUREA 500 MG PO CAPS: ORAL_CAPSULE | ORAL | 2 refills | Status: DC

## 2023-03-01 NOTE — Progress Notes (Signed)
Touched base with patient via video encounter to confirm his need for refilled Hydrea. 500 mg once daily sent to [facility] in [place].

## 2023-03-15 ENCOUNTER — Other Ambulatory Visit: Payer: Self-pay | Admitting: Medical Oncology

## 2023-03-15 DIAGNOSIS — D45 Polycythemia vera: Secondary | ICD-10-CM

## 2023-03-15 MED ORDER — HYDROXYUREA 500 MG PO CAPS
ORAL_CAPSULE | ORAL | 2 refills | Status: DC
Start: 2023-03-15 — End: 2023-06-28

## 2023-03-19 DIAGNOSIS — Z6825 Body mass index (BMI) 25.0-25.9, adult: Secondary | ICD-10-CM | POA: Diagnosis not present

## 2023-03-19 DIAGNOSIS — U071 COVID-19: Secondary | ICD-10-CM | POA: Diagnosis not present

## 2023-04-04 ENCOUNTER — Other Ambulatory Visit: Payer: Self-pay | Admitting: Family Medicine

## 2023-04-04 DIAGNOSIS — Z23 Encounter for immunization: Secondary | ICD-10-CM | POA: Diagnosis not present

## 2023-04-04 DIAGNOSIS — I700 Atherosclerosis of aorta: Secondary | ICD-10-CM | POA: Diagnosis not present

## 2023-04-04 DIAGNOSIS — R7301 Impaired fasting glucose: Secondary | ICD-10-CM | POA: Diagnosis not present

## 2023-04-04 DIAGNOSIS — M353 Polymyalgia rheumatica: Secondary | ICD-10-CM | POA: Diagnosis not present

## 2023-04-04 DIAGNOSIS — Z0000 Encounter for general adult medical examination without abnormal findings: Secondary | ICD-10-CM | POA: Diagnosis not present

## 2023-04-04 DIAGNOSIS — Z6825 Body mass index (BMI) 25.0-25.9, adult: Secondary | ICD-10-CM | POA: Diagnosis not present

## 2023-04-04 DIAGNOSIS — K219 Gastro-esophageal reflux disease without esophagitis: Secondary | ICD-10-CM | POA: Diagnosis not present

## 2023-04-04 DIAGNOSIS — I10 Essential (primary) hypertension: Secondary | ICD-10-CM | POA: Diagnosis not present

## 2023-04-04 DIAGNOSIS — L932 Other local lupus erythematosus: Secondary | ICD-10-CM | POA: Diagnosis not present

## 2023-04-04 DIAGNOSIS — E782 Mixed hyperlipidemia: Secondary | ICD-10-CM | POA: Diagnosis not present

## 2023-04-04 DIAGNOSIS — D45 Polycythemia vera: Secondary | ICD-10-CM | POA: Diagnosis not present

## 2023-04-22 ENCOUNTER — Ambulatory Visit
Admission: RE | Admit: 2023-04-22 | Discharge: 2023-04-22 | Disposition: A | Discharge status: Home / Self Care | Payer: Medicare HMO | Source: Ambulatory Visit | Attending: Family Medicine | Admitting: Family Medicine

## 2023-04-22 DIAGNOSIS — E782 Mixed hyperlipidemia: Secondary | ICD-10-CM | POA: Insufficient documentation

## 2023-06-28 ENCOUNTER — Other Ambulatory Visit: Payer: Self-pay

## 2023-06-28 ENCOUNTER — Encounter: Payer: Self-pay | Admitting: Nurse Practitioner

## 2023-06-28 ENCOUNTER — Inpatient Hospital Stay: Payer: Medicare HMO

## 2023-06-28 ENCOUNTER — Inpatient Hospital Stay: Payer: Medicare HMO | Attending: Oncology

## 2023-06-28 ENCOUNTER — Inpatient Hospital Stay: Payer: Medicare HMO | Admitting: Nurse Practitioner

## 2023-06-28 VITALS — BP 133/91 | Temp 97.8°F | Wt 178.0 lb

## 2023-06-28 DIAGNOSIS — D45 Polycythemia vera: Secondary | ICD-10-CM | POA: Insufficient documentation

## 2023-06-28 DIAGNOSIS — Z79899 Other long term (current) drug therapy: Secondary | ICD-10-CM

## 2023-06-28 DIAGNOSIS — Z7982 Long term (current) use of aspirin: Secondary | ICD-10-CM | POA: Insufficient documentation

## 2023-06-28 LAB — CBC WITH DIFFERENTIAL/PLATELET
Abs Immature Granulocytes: 0.16 10*3/uL — ABNORMAL HIGH (ref 0.00–0.07)
Basophils Absolute: 0.1 10*3/uL (ref 0.0–0.1)
Basophils Relative: 1 %
Eosinophils Absolute: 0.2 10*3/uL (ref 0.0–0.5)
Eosinophils Relative: 2 %
HCT: 44.8 % (ref 39.0–52.0)
Hemoglobin: 15.4 g/dL (ref 13.0–17.0)
Immature Granulocytes: 2 %
Lymphocytes Relative: 8 %
Lymphs Abs: 0.8 10*3/uL (ref 0.7–4.0)
MCH: 37.7 pg — ABNORMAL HIGH (ref 26.0–34.0)
MCHC: 34.4 g/dL (ref 30.0–36.0)
MCV: 109.8 fL — ABNORMAL HIGH (ref 80.0–100.0)
Monocytes Absolute: 0.5 10*3/uL (ref 0.1–1.0)
Monocytes Relative: 6 %
Neutro Abs: 7.9 10*3/uL — ABNORMAL HIGH (ref 1.7–7.7)
Neutrophils Relative %: 81 %
Platelets: 291 10*3/uL (ref 150–400)
RBC: 4.08 MIL/uL — ABNORMAL LOW (ref 4.22–5.81)
RDW: 15.2 % (ref 11.5–15.5)
WBC: 9.7 10*3/uL (ref 4.0–10.5)
nRBC: 0 % (ref 0.0–0.2)

## 2023-06-28 LAB — COMPREHENSIVE METABOLIC PANEL
ALT: 23 U/L (ref 0–44)
AST: 21 U/L (ref 15–41)
Albumin: 4.5 g/dL (ref 3.5–5.0)
Alkaline Phosphatase: 63 U/L (ref 38–126)
Anion gap: 9 (ref 5–15)
BUN: 26 mg/dL — ABNORMAL HIGH (ref 8–23)
CO2: 24 mmol/L (ref 22–32)
Calcium: 9.2 mg/dL (ref 8.9–10.3)
Chloride: 103 mmol/L (ref 98–111)
Creatinine, Ser: 1.17 mg/dL (ref 0.61–1.24)
GFR, Estimated: >60 mL/min (ref >60)
Glucose, Bld: 115 mg/dL — ABNORMAL HIGH (ref 70–99)
Potassium: 5.2 mmol/L — ABNORMAL HIGH (ref 3.5–5.1)
Sodium: 136 mmol/L (ref 135–145)
Total Bilirubin: 1.3 mg/dL — ABNORMAL HIGH (ref <1.2)
Total Protein: 7 g/dL (ref 6.5–8.1)

## 2023-06-28 LAB — IRON AND TIBC
Iron: 120 ug/dL (ref 45–182)
Saturation Ratios: 38 % (ref 17.9–39.5)
TIBC: 315 ug/dL (ref 250–450)
UIBC: 195 ug/dL

## 2023-06-28 LAB — FERRITIN: Ferritin: 109 ng/mL (ref 24–336)

## 2023-06-28 MED ORDER — HYDROXYUREA 500 MG PO CAPS: ORAL_CAPSULE | ORAL | 3 refills | Status: DC

## 2023-06-28 NOTE — Progress Notes (Signed)
No phleb today per NP

## 2023-06-28 NOTE — Progress Notes (Signed)
[facility] Hematology and Oncology Follow Up Visit  Joshua Ryan [id number] [birth year] 77 y.o. 06/28/2023 2:37 PM Daisy Floro, MDRoss, Darlen Round, MD   Diagnosis: JAK2 positive polycythemia vera diagnosed in 2018.    HPI: Presented to Dr. Clelia Croft in 2018 for polycythemia. Previously treated with prednisone with good response. CBC April 2018 - Hmg 19.3, MCV 69. Found to be JAK2 positive. High risk. Started hydrea 500 mg daily 03/01/2017. He has intermittently required phlebotomy with goal of Hct < 45. No history of thrombosis.   Leukocytosis & Thrombocytosis- thought to be reactive  Lupus- managed by Dr. Cheree Ditto   Interim History: Joshua Ryan returns today for a follow-up. He is here with his wife. He continues to tolerate hydrea well. No interval infections or hospitalizations. He has not required phlebotomy. Has been diagnosed with lupus and continues plaquenil.   Review of Systems  Constitutional:  Negative for chills, fever, malaise/fatigue and weight loss.  HENT:  Negative for hearing loss, nosebleeds, sore throat and tinnitus.   Eyes:  Negative for blurred vision and double vision.  Respiratory:  Negative for cough, hemoptysis, shortness of breath and wheezing.   Cardiovascular:  Negative for chest pain, palpitations and leg swelling.  Gastrointestinal:  Negative for abdominal pain, blood in stool, constipation, diarrhea, melena, nausea and vomiting.  Genitourinary:  Negative for dysuria and urgency.  Musculoskeletal:  Positive for joint pain. Negative for back pain, falls and myalgias.  Skin:  Positive for rash. Negative for itching.  Neurological:  Negative for dizziness, tingling, sensory change, loss of consciousness, weakness and headaches.  Endo/Heme/Allergies:  Negative for environmental allergies. Does not bruise/bleed easily.  Psychiatric/Behavioral:  Negative for depression. The patient is not nervous/anxious and does not have insomnia.      Medications: Reviewed without changes. Current Outpatient Medications  Medication Sig Dispense Refill   aspirin 81 MG tablet Take 81 mg by mouth daily.     Calcium-Magnesium-Vitamin D (CALCIUM 500 PO) Take 1 tablet by mouth 2 (two) times daily.     cetirizine (ZYRTEC) 10 MG tablet Take 10 mg by mouth daily.     cholecalciferol (VITAMIN D) 1000 UNITS tablet Take 2,000 Units by mouth daily.     famotidine (PEPCID) 20 MG tablet Take 20 mg by mouth 2 (two) times daily.     hydroxychloroquine (PLAQUENIL) 200 MG tablet Take 200 mg by mouth 2 (two) times daily.     irbesartan (AVAPRO) 300 MG tablet Take 300 mg by mouth daily.  3   predniSONE (DELTASONE) 1 MG tablet Take 2 mg by mouth daily.  2   hydroxyurea (HYDREA) 500 MG capsule Hydroxyurea 500 mg by mouth daily.  May take with food to minimize GI side effects. 90 capsule 3   KRILL OIL PO Take 500 mg by mouth daily. (Patient not taking: Reported on 06/28/2023)     Saw Palmetto, Serenoa repens, (SAW PALMETTO PO) Take 450 mg by mouth daily. (Patient not taking: Reported on 06/28/2023)     No current facility-administered medications for this visit.   Allergies: No Known Allergies  Vitals:  Today's Vitals   06/28/23 1313 06/28/23 1315  BP:  (!) 133/91  Temp:  97.8 F (36.6 C)  TempSrc:  Tympanic  SpO2:  100%  Weight:  178 lb (80.7 kg)  PainSc: 0-No pain    Body mass index is 25.54 kg/m.  Physical Exam Vitals reviewed.  Constitutional:      Appearance: He is not ill-appearing.  Cardiovascular:  Rate and Rhythm: Normal rate and regular rhythm.  Pulmonary:     Effort: No respiratory distress.  Skin:    Coloration: Skin is not pale.     Findings: Bruising present.  Neurological:     Mental Status: He is alert and oriented to person, place, and time.  Psychiatric:        Mood and Affect: Mood normal.        Behavior: Behavior normal.     Lab Results: Lab Results  Component Value Date   WBC 9.7 06/28/2023   HGB 15.4  06/28/2023   HCT 44.8 06/28/2023   MCV 109.8 (H) 06/28/2023   PLT 291 06/28/2023     Chemistry      Component Value Date/Time   NA 136 06/28/2023 1259   NA 139 06/28/2017 1451   K 5.2 (H) 06/28/2023 1259   K 5.5 No visable hemolysis (H) 06/28/2017 1451   CL 103 06/28/2023 1259   CO2 24 06/28/2023 1259   CO2 24 06/28/2017 1451   BUN 26 (H) 06/28/2023 1259   BUN 24.0 06/28/2017 1451   CREATININE 1.17 06/28/2023 1259   CREATININE 1.07 12/26/2022 1319   CREATININE 1.2 06/28/2017 1451      Component Value Date/Time   CALCIUM 9.2 06/28/2023 1259   CALCIUM 9.4 06/28/2017 1451   ALKPHOS 63 06/28/2023 1259   ALKPHOS 74 06/28/2017 1451   AST 21 06/28/2023 1259   AST 20 12/26/2022 1319   AST 19 06/28/2017 1451   ALT 23 06/28/2023 1259   ALT 15 12/26/2022 1319   ALT 19 06/28/2017 1451   BILITOT 1.3 (H) 06/28/2023 1259   BILITOT 0.7 12/26/2022 1319   BILITOT 1.01 06/28/2017 1451     Assessment and Plan: 77 y.o.male who returns to clinic for follow up of:   1.  Polycythemia vera - diagnosed 2018. JAK2 positive. On hydrea 500 mg daily. Tolerating well. Review natural course of PV and risk today as well as management. Hmg 15.4. Stable. Continue current dose.   2. Leukocytosis and thrombocytosis: Currently within normal range.  No need for adjustment of the hydroxyurea dosing.  3.  Lupus arthritis: He is currently on Plaquenil without any complications. Continue follow up with rheumatology.   Disposition No phlebotomy today RTC 6 months labs ( CBC w, Iron, ferritin, CMP), see MD to establish care, +/- phlebotomy - la  Consuello Masse, DNP, AGNP-C, AOCNP [facility] [phone] (clinic)

## 2023-07-24 DIAGNOSIS — K080 Exfoliation of teeth due to systemic causes: Secondary | ICD-10-CM | POA: Diagnosis not present

## 2023-07-29 DIAGNOSIS — M329 Systemic lupus erythematosus, unspecified: Secondary | ICD-10-CM | POA: Diagnosis not present

## 2023-08-06 DIAGNOSIS — K080 Exfoliation of teeth due to systemic causes: Secondary | ICD-10-CM | POA: Diagnosis not present

## 2023-08-19 DIAGNOSIS — M064 Inflammatory polyarthropathy: Secondary | ICD-10-CM | POA: Diagnosis not present

## 2023-08-19 DIAGNOSIS — L932 Other local lupus erythematosus: Secondary | ICD-10-CM | POA: Diagnosis not present

## 2023-08-19 DIAGNOSIS — M353 Polymyalgia rheumatica: Secondary | ICD-10-CM | POA: Diagnosis not present

## 2023-08-19 DIAGNOSIS — M1991 Primary osteoarthritis, unspecified site: Secondary | ICD-10-CM | POA: Diagnosis not present

## 2023-09-09 DIAGNOSIS — K080 Exfoliation of teeth due to systemic causes: Secondary | ICD-10-CM | POA: Diagnosis not present

## 2023-09-23 DIAGNOSIS — R7989 Other specified abnormal findings of blood chemistry: Secondary | ICD-10-CM | POA: Diagnosis not present

## 2023-12-27 ENCOUNTER — Encounter: Payer: Self-pay | Admitting: Oncology

## 2024-01-02 ENCOUNTER — Other Ambulatory Visit: Payer: Self-pay

## 2024-01-02 DIAGNOSIS — D45 Polycythemia vera: Secondary | ICD-10-CM

## 2024-01-03 ENCOUNTER — Encounter: Payer: Self-pay | Admitting: Oncology

## 2024-01-03 ENCOUNTER — Inpatient Hospital Stay: Payer: Medicare HMO | Attending: Oncology | Admitting: Oncology

## 2024-01-03 ENCOUNTER — Inpatient Hospital Stay: Payer: Medicare HMO

## 2024-01-03 ENCOUNTER — Other Ambulatory Visit: Payer: Self-pay

## 2024-01-03 DIAGNOSIS — Z79899 Other long term (current) drug therapy: Secondary | ICD-10-CM | POA: Diagnosis not present

## 2024-01-03 DIAGNOSIS — D45 Polycythemia vera: Secondary | ICD-10-CM | POA: Insufficient documentation

## 2024-01-03 DIAGNOSIS — Z7982 Long term (current) use of aspirin: Secondary | ICD-10-CM | POA: Diagnosis not present

## 2024-01-03 DIAGNOSIS — Z7964 Long term (current) use of myelosuppressive agent: Secondary | ICD-10-CM | POA: Insufficient documentation

## 2024-01-03 DIAGNOSIS — Z7952 Long term (current) use of systemic steroids: Secondary | ICD-10-CM | POA: Insufficient documentation

## 2024-01-03 LAB — COMPREHENSIVE METABOLIC PANEL WITH GFR
ALT: 16 U/L (ref 0–44)
AST: 20 U/L (ref 15–41)
Albumin: 4.5 g/dL (ref 3.5–5.0)
Alkaline Phosphatase: 59 U/L (ref 38–126)
Anion gap: 7 (ref 5–15)
BUN: 20 mg/dL (ref 8–23)
CO2: 26 mmol/L (ref 22–32)
Calcium: 9.1 mg/dL (ref 8.9–10.3)
Chloride: 103 mmol/L (ref 98–111)
Creatinine, Ser: 1.12 mg/dL (ref 0.61–1.24)
GFR, Estimated: >60 mL/min (ref >60)
Glucose, Bld: 116 mg/dL — ABNORMAL HIGH (ref 70–99)
Potassium: 5.1 mmol/L (ref 3.5–5.1)
Sodium: 136 mmol/L (ref 135–145)
Total Bilirubin: 1.6 mg/dL — ABNORMAL HIGH (ref 0.0–1.2)
Total Protein: 7.2 g/dL (ref 6.5–8.1)

## 2024-01-03 LAB — IRON AND TIBC
Iron: 110 ug/dL (ref 45–182)
Saturation Ratios: 36 % (ref 17.9–39.5)
TIBC: 307 ug/dL (ref 250–450)
UIBC: 197 ug/dL

## 2024-01-03 LAB — CBC WITH DIFFERENTIAL/PLATELET
Abs Immature Granulocytes: 0.17 10*3/uL — ABNORMAL HIGH (ref 0.00–0.07)
Basophils Absolute: 0.1 10*3/uL (ref 0.0–0.1)
Basophils Relative: 1 %
Eosinophils Absolute: 0.2 10*3/uL (ref 0.0–0.5)
Eosinophils Relative: 2 %
HCT: 47.4 % (ref 39.0–52.0)
Hemoglobin: 15.5 g/dL (ref 13.0–17.0)
Immature Granulocytes: 2 %
Lymphocytes Relative: 9 %
Lymphs Abs: 0.8 10*3/uL (ref 0.7–4.0)
MCH: 35.6 pg — ABNORMAL HIGH (ref 26.0–34.0)
MCHC: 32.7 g/dL (ref 30.0–36.0)
MCV: 109 fL — ABNORMAL HIGH (ref 80.0–100.0)
Monocytes Absolute: 0.6 10*3/uL (ref 0.1–1.0)
Monocytes Relative: 6 %
Neutro Abs: 8 10*3/uL — ABNORMAL HIGH (ref 1.7–7.7)
Neutrophils Relative %: 80 %
Platelets: 341 10*3/uL (ref 150–400)
RBC: 4.35 MIL/uL (ref 4.22–5.81)
RDW: 15.7 % — ABNORMAL HIGH (ref 11.5–15.5)
WBC: 9.9 10*3/uL (ref 4.0–10.5)
nRBC: 0 % (ref 0.0–0.2)

## 2024-01-03 LAB — BILIRUBIN, TOTAL AND DIRECT (CANCER CENTER ONLY)
Bilirubin, Direct: 0.2 mg/dL (ref 0.0–0.2)
Indirect Bilirubin: 1.4 mg/dL — ABNORMAL HIGH (ref 0.3–0.9)
Total Bilirubin: 1.6 mg/dL — ABNORMAL HIGH (ref 0.0–1.2)

## 2024-01-03 LAB — FERRITIN: Ferritin: 65 ng/mL (ref 24–336)

## 2024-01-03 MED ORDER — HYDROXYUREA 500 MG PO CAPS
ORAL_CAPSULE | ORAL | 1 refills | Status: DC
Start: 2024-01-03 — End: 2024-06-10

## 2024-01-03 NOTE — Progress Notes (Signed)
 Hematology/Oncology Progress note [phone] [phone]        REFERRING PROVIDER: Jimmey Mould, MD    CHIEF COMPLAINTS/PURPOSE OF CONSULTATION:  Polycythemia vera  ASSESSMENT & PLAN:   Polycythemia vera (HCC) Diagnosis 2018, +JAK2 V653F mutation, erythrocytosis with normal erythropoietin  No splenomegaly, no baseline bone marrow biopsy was done. Labs are reviewed and discussed with patient.  Hematocrit hematocrit is above 45. Continue hydroxyurea  500 mg daily.  Repeat hematocrit in 4 weeks, if persistently above 45 consider phlebotomy. We discussed about the possibility of future bone marrow biopsy if clinically indicated.    Hyperbilirubinemia Primarily increase of indirect bilirubin.  Likely Gilbert's syndrome no intervention needed.   Orders Placed This Encounter  Procedures   Bilirubin, Total and Direct (Cancer Center Only)    Standing Status:   Future    Number of Occurrences:   1    Expected Date:   01/03/2024    Expiration Date:   04/02/2024   CMP (Cancer Center only)    Standing Status:   Future    Expected Date:   05/04/2024    Expiration Date:   08/02/2024   CBC with Differential (Cancer Center Only)    Standing Status:   Future    Expected Date:   05/04/2024    Expiration Date:   08/02/2024   Iron and TIBC    Standing Status:   Future    Expected Date:   05/04/2024    Expiration Date:   08/02/2024   Ferritin    Standing Status:   Future    Expected Date:   05/04/2024    Expiration Date:   08/02/2024   Retic Panel    Standing Status:   Future    Expected Date:   05/04/2024    Expiration Date:   08/02/2024   Hemoglobin and Hematocrit (Cancer Center Only)    Standing Status:   Future    Expected Date:   02/02/2024    Expiration Date:   05/02/2024   Follow-up in 4 months All questions were answered. The patient knows to call the clinic with any problems, questions or concerns.  Timmy Forbes, MD, PhD [facility] Hematology  Oncology 01/03/2024    HISTORY OF PRESENTING ILLNESS:  Joshua Ryan. 78 y.o. male presents to establish care for polycythemia vera  Patient previously followed up with Dr.Shadad. Patient switched care to me on 01/03/2024 Extensive medical record review was performed by me Patient was diagnosed with JAK2 V6 53F mutation positive polycythemia vera in 2018.  He initially presented with erythrocytosis.  Patient has had phlebotomy as needed -last given in November 2019.  As well as hydroxyurea  500 mg started on 03/01/2017. He has tolerated hydroxyurea  well. He presented to establish for.  He has no new complaints.    MEDICAL HISTORY:  Past Medical History:  Diagnosis Date   BPH (benign prostatic hyperplasia)    ED (erectile dysfunction)    Gallstones    GERD (gastroesophageal reflux disease)    Hematuria    HTN (hypertension)    Mixed hyperlipidemia     SURGICAL HISTORY: History reviewed. No pertinent surgical history.  SOCIAL HISTORY: Social History   Socioeconomic History   Marital status: Married    Spouse name: Not on file   Number of children: Not on file   Years of education: Not on file   Highest education level: Not on file  Occupational History   Not on file  Tobacco Use   Smoking status:  Never   Smokeless tobacco: Never  Substance and Sexual Activity   Alcohol use: Yes    Alcohol/week: 0.0 standard drinks of alcohol   Drug use: No   Sexual activity: Yes    Birth control/protection: None  Other Topics Concern   Not on file  Social History Narrative   Not on file   Social Drivers of Health   Financial Resource Strain: Not on file  Food Insecurity: No Food Insecurity (01/03/2024)   Hunger Vital Sign    Worried About Running Out of Food in the Last Year: Never true    Ran Out of Food in the Last Year: Never true  Transportation Needs: No Transportation Needs (01/03/2024)   PRAPARE - Administrator, Civil Service (Medical): No    Lack of  Transportation (Non-Medical): No  Physical Activity: Not on file  Stress: Not on file  Social Connections: Not on file  Intimate Partner Violence: Not At Risk (01/03/2024)   Humiliation, Afraid, Rape, and Kick questionnaire    Fear of Current or Ex-Partner: No    Emotionally Abused: No    Physically Abused: No    Sexually Abused: No    FAMILY HISTORY: Family History  Problem Relation Age of Onset   Lung disease Father        chronic obx lung disease   Breast cancer Sister    Cancer Maternal Grandmother        lung?    ALLERGIES:  has no known allergies.  MEDICATIONS:  Current Outpatient Medications  Medication Sig Dispense Refill   Calcium-Magnesium-Vitamin D (CALCIUM 500 PO) Take 1 tablet by mouth 2 (two) times daily.     cetirizine (ZYRTEC) 10 MG tablet Take 10 mg by mouth daily.     cholecalciferol (VITAMIN D) 1000 UNITS tablet Take 2,000 Units by mouth daily.     cholecalciferol (VITAMIN D3) 25 MCG (1000 UNIT) tablet Take 1,000 Units by mouth daily.     famotidine (PEPCID) 20 MG tablet Take 20 mg by mouth 2 (two) times daily.     hydroxychloroquine (PLAQUENIL) 200 MG tablet Take 200 mg by mouth 2 (two) times daily.     irbesartan  (AVAPRO ) 300 MG tablet Take 300 mg by mouth daily.  3   predniSONE  (DELTASONE ) 1 MG tablet Take 2 mg by mouth daily.  2   aspirin 81 MG tablet Take 81 mg by mouth daily. (Patient not taking: Reported on 01/03/2024)     hydroxyurea  (HYDREA ) 500 MG capsule Hydroxyurea  500 mg by mouth daily.  May take with food to minimize GI side effects. 90 capsule 1   KRILL OIL PO Take 500 mg by mouth daily. (Patient not taking: Reported on 01/03/2024)     Saw Palmetto, Serenoa repens, (SAW PALMETTO PO) Take 450 mg by mouth daily. (Patient not taking: Reported on 01/03/2024)     No current facility-administered medications for this visit.    Review of Systems  Constitutional:  Negative for appetite change, chills, fatigue, fever and unexpected weight change.   HENT:   Negative for hearing loss and voice change.   Eyes:  Negative for eye problems and icterus.  Respiratory:  Negative for chest tightness, cough and shortness of breath.   Cardiovascular:  Negative for chest pain and leg swelling.  Gastrointestinal:  Negative for abdominal distention and abdominal pain.  Endocrine: Negative for hot flashes.  Genitourinary:  Negative for difficulty urinating, dysuria and frequency.   Musculoskeletal:  Negative for arthralgias.  Skin:  Negative for itching and rash.  Neurological:  Negative for light-headedness and numbness.  Hematological:  Negative for adenopathy. Does not bruise/bleed easily.  Psychiatric/Behavioral:  Negative for confusion.      PHYSICAL EXAMINATION: ECOG PERFORMANCE STATUS: 0 - Asymptomatic  Vitals:   01/03/24 1117  BP: 133/83  Pulse: 77  Resp: 20  Temp: 97.9 F (36.6 C)  SpO2: 100%   Filed Weights   01/03/24 1117  Weight: 177 lb 12.8 oz (80.6 kg)    Physical Exam Constitutional:      General: He is not in acute distress.    Appearance: He is not diaphoretic.  HENT:     Head: Normocephalic and atraumatic.     Mouth/Throat:     Pharynx: No oropharyngeal exudate.   Eyes:     General: No scleral icterus.   Cardiovascular:     Rate and Rhythm: Normal rate and regular rhythm.  Pulmonary:     Effort: Pulmonary effort is normal. No respiratory distress.     Breath sounds: Normal breath sounds.  Abdominal:     General: There is no distension.     Palpations: Abdomen is soft.     Tenderness: There is no abdominal tenderness.   Musculoskeletal:        General: Normal range of motion.     Cervical back: Normal range of motion and neck supple.   Skin:    General: Skin is warm and dry.     Findings: No erythema.   Neurological:     Mental Status: He is alert and oriented to person, place, and time.     Cranial Nerves: No cranial nerve deficit.     Motor: No abnormal muscle tone.     Coordination:  Coordination normal.   Psychiatric:        Mood and Affect: Mood and affect normal.      LABORATORY DATA:  I have reviewed the data as listed    Latest Ref Rng & Units 01/03/2024   11:07 AM 06/28/2023   12:59 PM 12/26/2022    1:19 PM  CBC  WBC 4.0 - 10.5 K/uL 9.9  9.7  9.6   Hemoglobin 13.0 - 17.0 g/dL 16.1  09.6  04.5   Hematocrit 39.0 - 52.0 % 47.4  44.8  45.3   Platelets 150 - 400 K/uL 341  291  360       Latest Ref Rng & Units 01/03/2024   11:07 AM 06/28/2023   12:59 PM 12/26/2022    1:19 PM  CMP  Glucose 70 - 99 mg/dL 409  811  77   BUN 8 - 23 mg/dL 20  26  16    Creatinine 0.61 - 1.24 mg/dL 9.14  7.82  9.56   Sodium 135 - 145 mmol/L 136  136  140   Potassium 3.5 - 5.1 mmol/L 5.1  5.2  5.2   Chloride 98 - 111 mmol/L 103  103  107   CO2 22 - 32 mmol/L 26  24  21    Calcium 8.9 - 10.3 mg/dL 9.1  9.2  9.1   Total Protein 6.5 - 8.1 g/dL 7.2  7.0  6.7   Total Bilirubin 0.0 - 1.2 mg/dL 0.0 - 1.2 mg/dL 1.6    1.6  1.3  0.7   Alkaline Phos 38 - 126 U/L 59  63  54   AST 15 - 41 U/L 20  21  20    ALT 0 - 44 U/L 16  23  15      RADIOGRAPHIC STUDIES: I have personally reviewed the radiological images as listed and agreed with the findings in the report. No results found.

## 2024-01-03 NOTE — Assessment & Plan Note (Signed)
 Primarily increase of indirect bilirubin.  Likely Gilbert's syndrome no intervention needed.

## 2024-01-03 NOTE — Assessment & Plan Note (Addendum)
 Diagnosis 2018, +JAK2 V617F mutation, erythrocytosis with normal erythropoietin  No splenomegaly, no baseline bone marrow biopsy was done. Labs are reviewed and discussed with patient.  Hematocrit hematocrit is above 45. Continue hydroxyurea  500 mg daily.  Repeat hematocrit in 4 weeks, if persistently above 45 consider phlebotomy. We discussed about the possibility of future bone marrow biopsy if clinically indicated.

## 2024-01-27 DIAGNOSIS — M329 Systemic lupus erythematosus, unspecified: Secondary | ICD-10-CM | POA: Diagnosis not present

## 2024-01-27 DIAGNOSIS — D492 Neoplasm of unspecified behavior of bone, soft tissue, and skin: Secondary | ICD-10-CM | POA: Diagnosis not present

## 2024-02-03 ENCOUNTER — Other Ambulatory Visit

## 2024-02-03 ENCOUNTER — Encounter

## 2024-02-04 ENCOUNTER — Inpatient Hospital Stay: Attending: Oncology

## 2024-02-04 ENCOUNTER — Inpatient Hospital Stay

## 2024-02-04 DIAGNOSIS — D45 Polycythemia vera: Secondary | ICD-10-CM | POA: Insufficient documentation

## 2024-02-04 LAB — HEMOGLOBIN AND HEMATOCRIT (CANCER CENTER ONLY)
HCT: 44.8 % (ref 39.0–52.0)
Hemoglobin: 15.1 g/dL (ref 13.0–17.0)

## 2024-02-04 NOTE — Progress Notes (Signed)
No phlebotomy today.

## 2024-02-10 DIAGNOSIS — R3912 Poor urinary stream: Secondary | ICD-10-CM | POA: Diagnosis not present

## 2024-02-10 DIAGNOSIS — N401 Enlarged prostate with lower urinary tract symptoms: Secondary | ICD-10-CM | POA: Diagnosis not present

## 2024-02-17 DIAGNOSIS — M1991 Primary osteoarthritis, unspecified site: Secondary | ICD-10-CM | POA: Diagnosis not present

## 2024-02-17 DIAGNOSIS — M353 Polymyalgia rheumatica: Secondary | ICD-10-CM | POA: Diagnosis not present

## 2024-02-17 DIAGNOSIS — L932 Other local lupus erythematosus: Secondary | ICD-10-CM | POA: Diagnosis not present

## 2024-02-17 DIAGNOSIS — M064 Inflammatory polyarthropathy: Secondary | ICD-10-CM | POA: Diagnosis not present

## 2024-04-06 DIAGNOSIS — R7301 Impaired fasting glucose: Secondary | ICD-10-CM | POA: Diagnosis not present

## 2024-04-06 DIAGNOSIS — Z23 Encounter for immunization: Secondary | ICD-10-CM | POA: Diagnosis not present

## 2024-04-06 DIAGNOSIS — Z0000 Encounter for general adult medical examination without abnormal findings: Secondary | ICD-10-CM | POA: Diagnosis not present

## 2024-04-06 DIAGNOSIS — K219 Gastro-esophageal reflux disease without esophagitis: Secondary | ICD-10-CM | POA: Diagnosis not present

## 2024-04-06 DIAGNOSIS — I10 Essential (primary) hypertension: Secondary | ICD-10-CM | POA: Diagnosis not present

## 2024-04-06 DIAGNOSIS — E782 Mixed hyperlipidemia: Secondary | ICD-10-CM | POA: Diagnosis not present

## 2024-05-04 ENCOUNTER — Inpatient Hospital Stay: Admitting: Oncology

## 2024-05-04 ENCOUNTER — Inpatient Hospital Stay: Attending: Family Medicine

## 2024-05-04 ENCOUNTER — Encounter: Payer: Self-pay | Admitting: Oncology

## 2024-05-04 ENCOUNTER — Inpatient Hospital Stay

## 2024-05-04 NOTE — Assessment & Plan Note (Deleted)
 Diagnosis 2018, +JAK2 V617F mutation, erythrocytosis with normal erythropoietin  No splenomegaly, no baseline bone marrow biopsy was done. Labs are reviewed and discussed with patient.  Hematocrit hematocrit is above 45. Continue hydroxyurea  500 mg daily.  Repeat hematocrit in 4 weeks, if persistently above 45 consider phlebotomy. We discussed about the possibility of future bone marrow biopsy if clinically indicated.

## 2024-06-08 DIAGNOSIS — K080 Exfoliation of teeth due to systemic causes: Secondary | ICD-10-CM | POA: Diagnosis not present

## 2024-06-10 ENCOUNTER — Inpatient Hospital Stay

## 2024-06-10 ENCOUNTER — Inpatient Hospital Stay: Admitting: Oncology

## 2024-06-10 ENCOUNTER — Encounter: Payer: Self-pay | Admitting: Oncology

## 2024-06-10 ENCOUNTER — Inpatient Hospital Stay: Attending: Family Medicine

## 2024-06-10 VITALS — BP 103/77 | HR 73 | Resp 18

## 2024-06-10 VITALS — BP 131/83 | HR 73 | Temp 97.8°F | Resp 18 | Wt 173.5 lb

## 2024-06-10 DIAGNOSIS — Z803 Family history of malignant neoplasm of breast: Secondary | ICD-10-CM | POA: Diagnosis not present

## 2024-06-10 DIAGNOSIS — R7989 Other specified abnormal findings of blood chemistry: Secondary | ICD-10-CM | POA: Diagnosis not present

## 2024-06-10 DIAGNOSIS — M353 Polymyalgia rheumatica: Secondary | ICD-10-CM | POA: Insufficient documentation

## 2024-06-10 DIAGNOSIS — D45 Polycythemia vera: Secondary | ICD-10-CM | POA: Insufficient documentation

## 2024-06-10 DIAGNOSIS — Z79899 Other long term (current) drug therapy: Secondary | ICD-10-CM | POA: Diagnosis not present

## 2024-06-10 DIAGNOSIS — Z7982 Long term (current) use of aspirin: Secondary | ICD-10-CM | POA: Diagnosis not present

## 2024-06-10 LAB — CBC WITH DIFFERENTIAL (CANCER CENTER ONLY)
Abs Immature Granulocytes: 0.17 K/uL — ABNORMAL HIGH (ref 0.00–0.07)
Basophils Absolute: 0.1 K/uL (ref 0.0–0.1)
Basophils Relative: 1 %
Eosinophils Absolute: 0.2 K/uL (ref 0.0–0.5)
Eosinophils Relative: 2 %
HCT: 49.2 % (ref 39.0–52.0)
Hemoglobin: 16.6 g/dL (ref 13.0–17.0)
Immature Granulocytes: 2 %
Lymphocytes Relative: 8 %
Lymphs Abs: 0.8 K/uL (ref 0.7–4.0)
MCH: 36 pg — ABNORMAL HIGH (ref 26.0–34.0)
MCHC: 33.7 g/dL (ref 30.0–36.0)
MCV: 106.7 fL — ABNORMAL HIGH (ref 80.0–100.0)
Monocytes Absolute: 0.6 K/uL (ref 0.1–1.0)
Monocytes Relative: 6 %
Neutro Abs: 8.5 K/uL — ABNORMAL HIGH (ref 1.7–7.7)
Neutrophils Relative %: 81 %
Platelet Count: 351 K/uL (ref 150–400)
RBC: 4.61 MIL/uL (ref 4.22–5.81)
RDW: 15.3 % (ref 11.5–15.5)
WBC Count: 10.3 K/uL (ref 4.0–10.5)
nRBC: 0 % (ref 0.0–0.2)

## 2024-06-10 LAB — RETIC PANEL
Immature Retic Fract: 20.2 % — ABNORMAL HIGH (ref 2.3–15.9)
RBC.: 4.63 MIL/uL (ref 4.22–5.81)
Retic Count, Absolute: 90.3 K/uL (ref 19.0–186.0)
Retic Ct Pct: 2 % (ref 0.4–3.1)
Reticulocyte Hemoglobin: 39.5 pg (ref >27.9)

## 2024-06-10 LAB — CMP (CANCER CENTER ONLY)
ALT: 18 U/L (ref 0–44)
AST: 24 U/L (ref 15–41)
Albumin: 4.6 g/dL (ref 3.5–5.0)
Alkaline Phosphatase: 62 U/L (ref 38–126)
Anion gap: 11 (ref 5–15)
BUN: 23 mg/dL (ref 8–23)
CO2: 25 mmol/L (ref 22–32)
Calcium: 9 mg/dL (ref 8.9–10.3)
Chloride: 101 mmol/L (ref 98–111)
Creatinine: 1.25 mg/dL — ABNORMAL HIGH (ref 0.61–1.24)
GFR, Estimated: 59 mL/min — ABNORMAL LOW (ref >60)
Glucose, Bld: 115 mg/dL — ABNORMAL HIGH (ref 70–99)
Potassium: 4.8 mmol/L (ref 3.5–5.1)
Sodium: 137 mmol/L (ref 135–145)
Total Bilirubin: 1.7 mg/dL — ABNORMAL HIGH (ref 0.0–1.2)
Total Protein: 7.4 g/dL (ref 6.5–8.1)

## 2024-06-10 LAB — FERRITIN: Ferritin: 138 ng/mL (ref 24–336)

## 2024-06-10 LAB — IRON AND TIBC
Iron: 120 ug/dL (ref 45–182)
Saturation Ratios: 38 % (ref 17.9–39.5)
TIBC: 316 ug/dL (ref 250–450)
UIBC: 196 ug/dL

## 2024-06-10 MED ORDER — HYDROXYUREA 500 MG PO CAPS: 1000.0000 mg | ORAL_CAPSULE | Freq: Every day | ORAL | 3 refills | Status: DC

## 2024-06-10 NOTE — Progress Notes (Signed)
 Hematology/Oncology Progress note [phone] [phone]        REFERRING PROVIDER: Okey Carlin Redbird, MD    CHIEF COMPLAINTS/PURPOSE OF CONSULTATION:  Polycythemia vera  ASSESSMENT & PLAN:   Polycythemia vera (HCC) Diagnosis 2018, +JAK2 V614F mutation, erythrocytosis with normal erythropoietin  No splenomegaly, no baseline bone marrow biopsy was done. Labs are reviewed and discussed with patient.  Hematocrit hematocrit is above 45. Phlebotomy 300 cc today. Recommend to increase hydroxyurea  to 1000 mg daily. We discussed bone marrow biopsy to confirm the diagnosis and patient would like to defer for now. Check H&H every 3 to 4 weeks +/- phlebotomy-goal of hematocrit is <=45 Continue aspirin 81 mg daily.   Elevated serum creatinine Encourage oral hydration and avoid nephrotoxins.      Orders Placed This Encounter  Procedures   Hemoglobin and Hematocrit (Cancer Center Only)    Standing Status:   Standing    Number of Occurrences:   4    Expiration Date:   06/10/2025   Follow-up per LOS All questions were answered. The patient knows to call the clinic with any problems, questions or concerns.  Zelphia Cap, MD, PhD [facility] Hematology Oncology 06/10/2024    HISTORY OF PRESENTING ILLNESS:  Joshua Ryan. 78 y.o. male presents to establish care for polycythemia vera  Patient previously followed up with Dr.Shadad. Patient switched care to me on 01/03/2024 Extensive medical record review was performed by me Patient was diagnosed with JAK2 V6 14F mutation positive polycythemia vera in 2018.  He initially presented with erythrocytosis.  Patient has had phlebotomy as needed -last given in November 2019.  As well as hydroxyurea  500 mg started on 03/01/2017. He has tolerated hydroxyurea  well. He presented to establish for.  He has no new complaints.  INTERVAL HISTORY Joshua Ryan. is a 78 y.o. male who has above history reviewed by me today  presents for follow up visit for JAK2 V617 F+ polycythemia vera  Patient reports feeling well.  He takes hydroxyurea  500 mg daily. He is also on prednisone  and Plaquenil for polymyalgia rheumatica.  Follows up with rheumatology. He takes aspirin 81 mg daily.  MEDICAL HISTORY:  Past Medical History:  Diagnosis Date   BPH (benign prostatic hyperplasia)    ED (erectile dysfunction)    Gallstones    GERD (gastroesophageal reflux disease)    Hematuria    HTN (hypertension)    Mixed hyperlipidemia     SURGICAL HISTORY: History reviewed. No pertinent surgical history.  SOCIAL HISTORY: Social History   Socioeconomic History   Marital status: Married    Spouse name: Not on file   Number of children: Not on file   Years of education: Not on file   Highest education level: Not on file  Occupational History   Not on file  Tobacco Use   Smoking status: Never   Smokeless tobacco: Never  Substance and Sexual Activity   Alcohol use: Yes    Alcohol/week: 0.0 standard drinks of alcohol   Drug use: No   Sexual activity: Yes    Birth control/protection: None  Other Topics Concern   Not on file  Social History Narrative   Not on file   Social Drivers of Health   Financial Resource Strain: Not on file  Food Insecurity: No Food Insecurity (01/03/2024)   Hunger Vital Sign    Worried About Running Out of Food in the Last Year: Never true    Ran Out of Food in the Last Year: Never  true  Transportation Needs: No Transportation Needs (01/03/2024)   PRAPARE - Administrator, Civil Service (Medical): No    Lack of Transportation (Non-Medical): No  Physical Activity: Not on file  Stress: Not on file  Social Connections: Not on file  Intimate Partner Violence: Not At Risk (01/03/2024)   Humiliation, Afraid, Rape, and Kick questionnaire    Fear of Current or Ex-Partner: No    Emotionally Abused: No    Physically Abused: No    Sexually Abused: No    FAMILY HISTORY: Family  History  Problem Relation Age of Onset   Lung disease Father        chronic obx lung disease   Breast cancer Sister    Cancer Maternal Grandmother        lung?    ALLERGIES:  has no known allergies.  MEDICATIONS:  Current Outpatient Medications  Medication Sig Dispense Refill   aspirin 81 MG tablet Take 81 mg by mouth daily.     Calcium-Magnesium-Vitamin D (CALCIUM 500 PO) Take 1 tablet by mouth 2 (two) times daily.     cholecalciferol (VITAMIN D) 1000 UNITS tablet Take 2,000 Units by mouth daily.     cholecalciferol (VITAMIN D3) 25 MCG (1000 UNIT) tablet Take 1,000 Units by mouth daily.     famotidine (PEPCID) 20 MG tablet Take 20 mg by mouth 2 (two) times daily.     hydroxychloroquine (PLAQUENIL) 200 MG tablet Take 200 mg by mouth 2 (two) times daily.     irbesartan  (AVAPRO ) 300 MG tablet Take 300 mg by mouth daily.  3   predniSONE  (DELTASONE ) 1 MG tablet Take 2 mg by mouth daily.  2   cetirizine (ZYRTEC) 10 MG tablet Take 10 mg by mouth daily. (Patient not taking: Reported on 06/10/2024)     hydroxyurea  (HYDREA ) 500 MG capsule Take 2 capsules (1,000 mg total) by mouth daily. Hydroxyurea  500 mg by mouth daily.  May take with food to minimize GI side effects. 60 capsule 3   KRILL OIL PO Take 500 mg by mouth daily. (Patient not taking: Reported on 06/10/2024)     No current facility-administered medications for this visit.    Review of Systems  Constitutional:  Negative for appetite change, chills, fatigue, fever and unexpected weight change.  HENT:   Negative for hearing loss and voice change.   Eyes:  Negative for eye problems and icterus.  Respiratory:  Negative for chest tightness, cough and shortness of breath.   Cardiovascular:  Negative for chest pain and leg swelling.  Gastrointestinal:  Negative for abdominal distention and abdominal pain.  Endocrine: Negative for hot flashes.  Genitourinary:  Negative for difficulty urinating, dysuria and frequency.   Musculoskeletal:   Negative for arthralgias.  Skin:  Negative for itching and rash.  Neurological:  Negative for light-headedness and numbness.  Hematological:  Negative for adenopathy. Does not bruise/bleed easily.  Psychiatric/Behavioral:  Negative for confusion.      PHYSICAL EXAMINATION: ECOG PERFORMANCE STATUS: 0 - Asymptomatic  Vitals:   06/10/24 1310  BP: 131/83  Pulse: 73  Resp: 18  Temp: 97.8 F (36.6 C)  SpO2: 99%   Filed Weights   06/10/24 1310  Weight: 173 lb 8 oz (78.7 kg)    Physical Exam Constitutional:      General: He is not in acute distress.    Appearance: He is not diaphoretic.  HENT:     Head: Normocephalic and atraumatic.  Eyes:     General:  No scleral icterus. Cardiovascular:     Rate and Rhythm: Normal rate and regular rhythm.  Pulmonary:     Effort: Pulmonary effort is normal. No respiratory distress.     Breath sounds: Normal breath sounds.  Abdominal:     General: There is no distension.     Palpations: Abdomen is soft.  Musculoskeletal:        General: Normal range of motion.     Cervical back: Normal range of motion.  Skin:    General: Skin is warm and dry.     Findings: No erythema.  Neurological:     Mental Status: He is alert and oriented to person, place, and time.     Cranial Nerves: No cranial nerve deficit.     Motor: No abnormal muscle tone.     Coordination: Coordination normal.  Psychiatric:        Mood and Affect: Mood and affect normal.      LABORATORY DATA:  I have reviewed the data as listed    Latest Ref Rng & Units 06/10/2024   12:32 PM 02/04/2024   10:25 AM 01/03/2024   11:07 AM  CBC  WBC 4.0 - 10.5 K/uL 10.3   9.9   Hemoglobin 13.0 - 17.0 g/dL 83.3  84.8  84.4   Hematocrit 39.0 - 52.0 % 49.2  44.8  47.4   Platelets 150 - 400 K/uL 351   341       Latest Ref Rng & Units 06/10/2024   12:32 PM 01/03/2024   11:07 AM 06/28/2023   12:59 PM  CMP  Glucose 70 - 99 mg/dL 884  883  884   BUN 8 - 23 mg/dL 23  20  26     Creatinine 0.61 - 1.24 mg/dL 8.74  8.87  8.82   Sodium 135 - 145 mmol/L 137  136  136   Potassium 3.5 - 5.1 mmol/L 4.8  5.1  5.2   Chloride 98 - 111 mmol/L 101  103  103   CO2 22 - 32 mmol/L 25  26  24    Calcium 8.9 - 10.3 mg/dL 9.0  9.1  9.2   Total Protein 6.5 - 8.1 g/dL 7.4  7.2  7.0   Total Bilirubin 0.0 - 1.2 mg/dL 1.7  1.6    1.6  1.3   Alkaline Phos 38 - 126 U/L 62  59  63   AST 15 - 41 U/L 24  20  21    ALT 0 - 44 U/L 18  16  23       RADIOGRAPHIC STUDIES: I have personally reviewed the radiological images as listed and agreed with the findings in the report. No results found.

## 2024-06-10 NOTE — Progress Notes (Signed)
 Joshua Joshua. presents today for phlebotomy per MD orders. Phlebotomy procedure started at 1344 and ended at 1356. 300 mls removed. Patient tolerated procedure well. IV needle removed intact.

## 2024-06-10 NOTE — Patient Instructions (Signed)

## 2024-06-10 NOTE — Assessment & Plan Note (Addendum)
 Diagnosis 2018, +JAK2 V617F mutation, erythrocytosis with normal erythropoietin  No splenomegaly, no baseline bone marrow biopsy was done. Labs are reviewed and discussed with patient.  Hematocrit hematocrit is above 45. Phlebotomy 300 cc today. Recommend to increase hydroxyurea  to 1000 mg daily. We discussed bone marrow biopsy to confirm the diagnosis and patient would like to defer for now. Check H&H every 3 to 4 weeks +/- phlebotomy-goal of hematocrit is <=45 Continue aspirin 81 mg daily.

## 2024-06-10 NOTE — Assessment & Plan Note (Signed)
 Encourage oral hydration and avoid nephrotoxins.

## 2024-06-29 ENCOUNTER — Inpatient Hospital Stay: Attending: Family Medicine

## 2024-06-29 ENCOUNTER — Inpatient Hospital Stay

## 2024-06-29 DIAGNOSIS — D45 Polycythemia vera: Secondary | ICD-10-CM | POA: Diagnosis present

## 2024-06-29 LAB — HEMOGLOBIN AND HEMATOCRIT (CANCER CENTER ONLY)
HCT: 44.7 % (ref 39.0–52.0)
Hemoglobin: 15 g/dL (ref 13.0–17.0)

## 2024-06-29 NOTE — Progress Notes (Signed)
 No phlebotomy today Hct below 45.

## 2024-06-30 ENCOUNTER — Other Ambulatory Visit: Payer: Self-pay | Admitting: Nurse Practitioner

## 2024-06-30 DIAGNOSIS — D45 Polycythemia vera: Secondary | ICD-10-CM

## 2024-07-01 ENCOUNTER — Encounter: Payer: Self-pay | Admitting: Oncology

## 2024-07-01 ENCOUNTER — Inpatient Hospital Stay

## 2024-07-27 ENCOUNTER — Inpatient Hospital Stay: Attending: Family Medicine

## 2024-07-27 ENCOUNTER — Inpatient Hospital Stay (HOSPITAL_BASED_OUTPATIENT_CLINIC_OR_DEPARTMENT_OTHER)

## 2024-07-27 DIAGNOSIS — D45 Polycythemia vera: Secondary | ICD-10-CM

## 2024-07-27 LAB — CMP (CANCER CENTER ONLY)
ALT: 17 U/L (ref 0–44)
AST: 20 U/L (ref 15–41)
Albumin: 4.6 g/dL (ref 3.5–5.0)
Alkaline Phosphatase: 66 U/L (ref 38–126)
Anion gap: 10 (ref 5–15)
BUN: 21 mg/dL (ref 8–23)
CO2: 25 mmol/L (ref 22–32)
Calcium: 9.6 mg/dL (ref 8.9–10.3)
Chloride: 102 mmol/L (ref 98–111)
Creatinine: 1.23 mg/dL (ref 0.61–1.24)
GFR, Estimated: 59 mL/min — ABNORMAL LOW
Glucose, Bld: 136 mg/dL — ABNORMAL HIGH (ref 70–99)
Potassium: 4.5 mmol/L (ref 3.5–5.1)
Sodium: 137 mmol/L (ref 135–145)
Total Bilirubin: 1.1 mg/dL (ref 0.0–1.2)
Total Protein: 6.9 g/dL (ref 6.5–8.1)

## 2024-07-27 LAB — HEMOGLOBIN AND HEMATOCRIT (CANCER CENTER ONLY)
HCT: 40 % (ref 39.0–52.0)
Hemoglobin: 13.9 g/dL (ref 13.0–17.0)

## 2024-07-27 NOTE — Progress Notes (Signed)
 No phlebotomy today Per parameters

## 2024-07-29 ENCOUNTER — Inpatient Hospital Stay

## 2024-08-24 ENCOUNTER — Inpatient Hospital Stay

## 2024-08-24 ENCOUNTER — Inpatient Hospital Stay: Attending: Family Medicine

## 2024-08-26 ENCOUNTER — Inpatient Hospital Stay

## 2024-09-21 ENCOUNTER — Inpatient Hospital Stay

## 2024-09-21 ENCOUNTER — Inpatient Hospital Stay: Admitting: Oncology

## 2024-09-23 ENCOUNTER — Inpatient Hospital Stay

## 2024-09-23 ENCOUNTER — Inpatient Hospital Stay: Admitting: Oncology
